# Patient Record
Sex: Female | Born: 1955
Health system: Southern US, Community
[De-identification: ages and names within clinical notes are randomized; demographics above are authoritative.]

## PROBLEM LIST (undated history)

## (undated) DIAGNOSIS — I10 Essential (primary) hypertension: Secondary | ICD-10-CM

## (undated) DIAGNOSIS — K219 Gastro-esophageal reflux disease without esophagitis: Secondary | ICD-10-CM

## (undated) DIAGNOSIS — E119 Type 2 diabetes mellitus without complications: Secondary | ICD-10-CM

## (undated) DIAGNOSIS — Z9889 Other specified postprocedural states: Secondary | ICD-10-CM

## (undated) DIAGNOSIS — E7800 Pure hypercholesterolemia, unspecified: Secondary | ICD-10-CM

## (undated) HISTORY — DX: Type 2 diabetes mellitus without complications: E11.9

## (undated) HISTORY — DX: Gastro-esophageal reflux disease without esophagitis: K21.9

## (undated) HISTORY — PX: OTHER SURGICAL HISTORY: SHX169

## (undated) HISTORY — DX: Essential (primary) hypertension: I10

## (undated) HISTORY — DX: Other specified postprocedural states: Z98.890

## (undated) HISTORY — DX: Pure hypercholesterolemia, unspecified: E78.00

---

## 2005-11-18 ENCOUNTER — Encounter (INDEPENDENT_AMBULATORY_CARE_PROVIDER_SITE_OTHER): Payer: Self-pay | Admitting: Specialist

## 2005-11-19 ENCOUNTER — Ambulatory Visit: Payer: Self-pay | Admitting: Internal Medicine

## 2005-11-19 ENCOUNTER — Ambulatory Visit (HOSPITAL_COMMUNITY): Admission: RE | Admit: 2005-11-19 | Discharge: 2005-11-19 | Payer: Self-pay | Admitting: Internal Medicine

## 2007-04-05 ENCOUNTER — Ambulatory Visit (HOSPITAL_COMMUNITY): Admission: RE | Admit: 2007-04-05 | Discharge: 2007-04-05 | Payer: Self-pay | Admitting: Obstetrics and Gynecology

## 2007-12-26 ENCOUNTER — Other Ambulatory Visit: Admission: RE | Admit: 2007-12-26 | Discharge: 2007-12-26 | Payer: Self-pay | Admitting: Obstetrics and Gynecology

## 2008-05-17 ENCOUNTER — Ambulatory Visit (HOSPITAL_COMMUNITY): Admission: RE | Admit: 2008-05-17 | Discharge: 2008-05-17 | Payer: Self-pay | Admitting: Obstetrics and Gynecology

## 2009-02-06 ENCOUNTER — Other Ambulatory Visit: Admission: RE | Admit: 2009-02-06 | Discharge: 2009-02-06 | Payer: Self-pay | Admitting: Obstetrics and Gynecology

## 2009-05-21 ENCOUNTER — Ambulatory Visit (HOSPITAL_COMMUNITY): Admission: RE | Admit: 2009-05-21 | Discharge: 2009-05-21 | Payer: Self-pay | Admitting: Obstetrics and Gynecology

## 2010-07-24 ENCOUNTER — Other Ambulatory Visit
Admission: RE | Admit: 2010-07-24 | Discharge: 2010-07-24 | Payer: Self-pay | Source: Home / Self Care | Admitting: Obstetrics and Gynecology

## 2010-11-18 ENCOUNTER — Encounter: Payer: Self-pay | Admitting: Internal Medicine

## 2010-11-27 ENCOUNTER — Encounter: Payer: Self-pay | Admitting: Gastroenterology

## 2010-11-27 ENCOUNTER — Ambulatory Visit (INDEPENDENT_AMBULATORY_CARE_PROVIDER_SITE_OTHER): Payer: 59 | Admitting: Gastroenterology

## 2010-11-27 VITALS — BP 141/84 | HR 106 | Temp 98.5°F | Ht 61.0 in | Wt 160.0 lb

## 2010-11-27 DIAGNOSIS — K219 Gastro-esophageal reflux disease without esophagitis: Secondary | ICD-10-CM

## 2010-11-27 DIAGNOSIS — Z86010 Personal history of colonic polyps: Secondary | ICD-10-CM

## 2010-11-27 NOTE — Progress Notes (Signed)
Primary Care Physician:  GRIFFIN,JENNIFER, NP, NP Primary Gastroenterologist:  Dr. Jena Gauss  Chief Complaint  Patient presents with  . Colon Cancer Screening    HPI:  Margaret Flowers is a 55 y.o. female here as a new patient for a visit prior to updated surveillance colonoscopy. Last colonoscopy in 2007: internal hemorrhoids, tubular adenomas. She is doing well without any concerns currently. Denies abdominal pain, constipation, N/V, melena, brbpr, lack of appetite, or wt loss. No change in bowel habits. Hx of GERD, controlled on Prilosec daily. Denies dysphagia or odynophagia.   Past Medical History  Diagnosis Date  . Hypertension   . GERD (gastroesophageal reflux disease)   . S/P colonoscopy 2002, 2007    2002: tubular adenomas; 2007: internal hemorrhoids, tubular adenoma    Past Surgical History  Procedure Date  . None     Current Outpatient Prescriptions  Medication Sig Dispense Refill  . amLODipine (NORVASC) 10 MG tablet Take 10 mg by mouth daily.        . Cholecalciferol (VITAMIN D3) 5000 UNITS CAPS Take by mouth.        . CHONDROITIN SULFATE PO Take 600 mg by mouth.        . fish oil-omega-3 fatty acids 1000 MG capsule Take 2 g by mouth daily.        . hydrochlorothiazide (,MICROZIDE/HYDRODIURIL,) 12.5 MG capsule Take 12.5 mg by mouth daily.        . Nutritional Supplements (GLUCOSAMINE FORTE PO) Take 750 mg by mouth.        Marland Kitchen omeprazole (PRILOSEC) 20 MG capsule Take 20 mg by mouth daily.        Marland Kitchen POTASSIUM & SODIUM PHOSPHATES PO Take 550 mg by mouth.          Allergies as of 11/27/2010  . (No Known Allergies)    Family History  Problem Relation Age of Onset  . Kidney cancer Mother     living  . Skin cancer Mother   . Breast cancer Mother   . Breast cancer Sister     living  . Pernicious anemia Father     deceased  . Pancreatic cancer Brother     passed away age 98    History   Social History  . Marital Status: Married    Spouse Name: N/A    Number  of Children: N/A  . Years of Education: N/A   Occupational History  . Full-time      Web designer    Social History Main Topics  . Smoking status: Never Smoker   . Smokeless tobacco: Not on file  . Alcohol Use: No  . Drug Use: No     Review of Systems: Gen: Denies any fever, chills, sweats, anorexia, fatigue, weakness, malaise, weight loss, and sleep disorder CV: Denies chest pain, angina, palpitations, syncope, orthopnea, PND, peripheral edema, and claudication. Resp: Denies dyspnea at rest, dyspnea with exercise, cough, sputum, wheezing, coughing up blood, and pleurisy. GI: Denies vomiting blood, jaundice, and fecal incontinence.   Denies dysphagia or odynophagia. GU : Denies urinary burning, blood in urine, urinary frequency, urinary hesitancy, nocturnal urination, and urinary incontinence. MS: Denies joint pain, limitation of movement, and swelling, stiffness, low back pain, extremity pain. Denies muscle weakness, cramps, atrophy.  Derm: Denies rash, itching, dry skin, hives, moles, warts, or unhealing ulcers.  Psych: Denies depression, anxiety, memory loss, suicidal ideation, hallucinations, paranoia, and confusion. Heme: Denies bruising, bleeding, and enlarged lymph nodes.  Physical Exam:  BP 141/84  Pulse 106  Temp(Src) 98.5 F (36.9 C) (Temporal)  Ht 5\' 1"  (1.549 m)  Wt 160 lb (72.576 kg)  BMI 30.23 kg/m2 General:   Alert,  Well-developed, well-nourished, pleasant and cooperative in NAD Head:  Normocephalic and atraumatic. Eyes:  Sclera clear, no icterus.   Conjunctiva pink. Ears:  Normal auditory acuity. Nose:  No deformity, discharge,  or lesions. Mouth:  No deformity or lesions, dentition normal. Neck:  Supple; no masses or thyromegaly. Lungs:  Clear throughout to auscultation.   No wheezes, crackles, or rhonchi. No acute distress. Heart:  Regular rate and rhythm; no murmurs, clicks, rubs,  or gallops. Abdomen:  Soft, nontender and nondistended.  No masses, hepatosplenomegaly or hernias noted. Normal bowel sounds, without guarding, and without rebound.   Rectal:  Deferred until time of colonoscopy.   Msk:  Symmetrical without gross deformities. Normal posture. Extremities:  Without clubbing or edema. Neurologic:  Alert and  oriented x4;  grossly normal neurologically. Skin:  Intact without significant lesions or rashes. Cervical Nodes:  No significant cervical adenopathy. Psych:  Alert and cooperative. Normal mood and affect.

## 2010-11-28 ENCOUNTER — Encounter: Payer: Self-pay | Admitting: Gastroenterology

## 2010-11-28 DIAGNOSIS — K219 Gastro-esophageal reflux disease without esophagitis: Secondary | ICD-10-CM | POA: Insufficient documentation

## 2010-11-28 DIAGNOSIS — Z86010 Personal history of colonic polyps: Secondary | ICD-10-CM | POA: Insufficient documentation

## 2010-11-28 NOTE — Assessment & Plan Note (Signed)
55 year old, very pleasant Caucasian female who presents for routine surveillance colonoscopy. Hx of tubular adenomas in past both in 2002 and 2007. Denies any abdominal pain, N/V, or rectal bleeding. No concerns currently and doing well.  Proceed with TCS with Dr. Jena Gauss in near future: the risks, benefits, and alternatives have been discussed with the patient in detail. The patient states understanding and desires to proceed.

## 2010-11-28 NOTE — Assessment & Plan Note (Signed)
Without dysphagia or odynophagia. No break-through reflux. Doing well on Prilosec daily. Continue current plan, no new interventions needed.

## 2010-11-28 NOTE — Op Note (Signed)
Margaret Flowers, Margaret Flowers            ACCOUNT NO.:  [id number]   MEDICAL RECORD NO.:  [id number]          PATIENT TYPE:  AMB   LOCATION:  DAY                           FACILITY:  [facility]   PHYSICIAN:  R. Roetta Sessions, M.D. DATE OF BIRTH:  [birth year]   DATE OF PROCEDURE:  11/19/2005  DATE OF DISCHARGE:                                 OPERATIVE REPORT   PROCEDURE PERFORMED:  Colonoscopy with cold biopsy.   INDICATIONS FOR PROCEDURE:  The patient is a  55 year old lady with a  history of colonic adenomas removed in 2002.  She is here for surveillance.  She does not have any lower GI tract symptoms.  Colonoscopy is now being  done as a surveillance maneuver.  This approach has been discussed with the  patient at length.  Potential risks, benefits and alternatives have been  reviewed, questions have been answered, she is agreeable.  Please see  documentation in the medical records.   PROCEDURE NOTE:  Oxygen saturations, blood pressure, pulse and respirations  were monitored throughout the entire procedure.   CONSCIOUS SEDATION:  Versed 6 mg IV, Demerol 100 mg IV in divided doses.   INSTRUMENT USED:  Olympus video chip system.   FINDINGS:  Digital rectal exam revealed no abnormalities.   ENDOSCOPIC FINDINGS:  Prep was good.   Rectum:  Examination of the rectal mucosa including retroflex view of the  anal verge revealed only a couple of anal papilla and some internal  hemorrhoids.  Colon:  Colonic mucosa was surveyed from the rectosigmoid junction to the  left, transverse and right colon to the area of the appendiceal orifice and  ileocecal valve and cecum.  These structures were well seen and photographed  for the record.  From this level, the scope was slowly withdrawn.  All  previously mentioned mucosal surfaces were again seen.  The patient was  noted to have a 4 mm sessile polyp on a fold opposite the ileocecal valve.  Please see photos.  It was cold biopsied and removed.  Remainder  of colonic  mucosa appeared normal.  The patient tolerated the procedure well, was  reacted in endoscopy.   IMPRESSION:  1.  Anal papilla and internal hemorrhoids, otherwise normal rectum.  2.  Diminutive polyp opposite ileocecal valve, cold biopsied/removed.      Remainder of colonic mucosa appeared normal.   RECOMMENDATIONS:  1.  Follow-up on pathology.  2.  Further recommendations to follow.      Jonathon Bellows, M.D.  Electronically Signed     RMR/MEDQ  D:  11/19/2005  T:  11/20/2005  Job:  [id number]

## 2010-12-01 NOTE — Progress Notes (Signed)
Cc to PCP 

## 2010-12-11 ENCOUNTER — Other Ambulatory Visit: Payer: Self-pay | Admitting: Internal Medicine

## 2010-12-11 ENCOUNTER — Ambulatory Visit (HOSPITAL_COMMUNITY)
Admission: RE | Admit: 2010-12-11 | Discharge: 2010-12-11 | Disposition: A | Discharge status: Home / Self Care | Payer: 59 | Source: Ambulatory Visit | Attending: Internal Medicine | Admitting: Internal Medicine

## 2010-12-11 ENCOUNTER — Encounter: Payer: 59 | Admitting: Internal Medicine

## 2010-12-11 DIAGNOSIS — D129 Benign neoplasm of anus and anal canal: Secondary | ICD-10-CM | POA: Insufficient documentation

## 2010-12-11 DIAGNOSIS — Z860100 Personal history of colon polyps, unspecified: Secondary | ICD-10-CM | POA: Insufficient documentation

## 2010-12-11 DIAGNOSIS — Z79899 Other long term (current) drug therapy: Secondary | ICD-10-CM | POA: Insufficient documentation

## 2010-12-11 DIAGNOSIS — D128 Benign neoplasm of rectum: Secondary | ICD-10-CM | POA: Insufficient documentation

## 2010-12-11 DIAGNOSIS — K5730 Diverticulosis of large intestine without perforation or abscess without bleeding: Secondary | ICD-10-CM

## 2010-12-11 DIAGNOSIS — D126 Benign neoplasm of colon, unspecified: Secondary | ICD-10-CM | POA: Insufficient documentation

## 2010-12-11 DIAGNOSIS — I10 Essential (primary) hypertension: Secondary | ICD-10-CM | POA: Insufficient documentation

## 2010-12-15 ENCOUNTER — Other Ambulatory Visit: Payer: Self-pay | Admitting: Obstetrics & Gynecology

## 2010-12-15 DIAGNOSIS — Z139 Encounter for screening, unspecified: Secondary | ICD-10-CM

## 2010-12-18 ENCOUNTER — Encounter: Payer: Self-pay | Admitting: Internal Medicine

## 2010-12-25 ENCOUNTER — Ambulatory Visit (HOSPITAL_COMMUNITY)
Admission: RE | Admit: 2010-12-25 | Discharge: 2010-12-25 | Disposition: A | Discharge status: Home / Self Care | Payer: 59 | Source: Ambulatory Visit | Attending: Obstetrics & Gynecology | Admitting: Obstetrics & Gynecology

## 2010-12-25 DIAGNOSIS — Z139 Encounter for screening, unspecified: Secondary | ICD-10-CM

## 2010-12-25 DIAGNOSIS — Z1231 Encounter for screening mammogram for malignant neoplasm of breast: Secondary | ICD-10-CM | POA: Insufficient documentation

## 2011-01-26 NOTE — Consult Note (Signed)
  NAMEAVICE, FUNCHESS            ACCOUNT NO.:  [id number]  MEDICAL RECORD NO.:  [id number]           PATIENT TYPE:  O  LOCATION:  DAYP                          FACILITY:  [facility]  PHYSICIAN:  R. Roetta Sessions, MD FACP FACGDATE OF BIRTH:  12/15/1955  DATE OF CONSULTATION:  12/11/2010 DATE OF DISCHARGE:                                CONSULTATION   PROCEDURE:  Ileocolonoscopy with snare polypectomy biopsy.  INDICATIONS FOR PROCEDURE:  A 55 year old lady with history of colonic adenomas removed in 2002-2008.  She is here for surveillance, has no lower GI tract symptoms at this time.  Risks, benefits, limitations, alternatives, imponderables have been discussed, questions answered. Please see the documentation in the medical record.  PROCEDURE NOTE:  O2 saturation, blood pressure, pulse, respirations were monitored throughout the entirety of the procedure.  CONSCIOUS SEDATION:  Versed 5 mg IV, Demerol 75 mg IV in divided doses.  INSTRUMENT:  Pentax video chip system.  FINDINGS:  Digital rectal exam revealed no abnormalities.  Endoscopic findings:  Prep was adequate.  Colon:  Colonic mucosa was surveyed from the rectosigmoid junction through the left transverse right colon to the appendiceal orifice ileocecal valve/cecum.  These structures were well seen and photographed the record.  From this level, scope was slowly and cautiously withdrawn.  All previously mentioned mucosal surfaces were again seen.  Terminal ileum was intubated to 10 cm.  The patient had multiple colonic polyps.  She had a diminutive cecal polyp which was removed with cold biopsy technique.  Sigmoid polyps which removed with cold and hot snare.  There was a descending colon polyp which was removed with hot snare quadrant, but was not recovered for the pathologist.  The patient had pancolonic diverticula, however, the remainder of colonic mucosa appeared normal.  Scope was pulled down to the rectum where a  thorough examination of rectal mucosa including retroflexed view of the anal verge demonstrated a 6-mm polyp at 4 cm at the anal verge was hot snare removed and recovered.  The patient tolerated the procedure well.  Cecal withdrawal time 11 minutes.  IMPRESSION: 1. Rectal polyp with status post hot snare polypectomy. 2. Multiple colonic polyps with status post biopsy and/or snare     polypectomy as described above. 3. Pancolonic diverticula.  RECOMMENDATIONS: 1. No aspirin or arthritis medications for 7 days. 2. Follow up on path.  Followup on diverticulosis literature provided     to Ms. Sills. 3. Further recommendations to follow.     Jonathon Bellows, MD [facility]     RMR/MEDQ  D:  12/11/2010  T:  12/12/2010  Job:  [id number]  cc:   Tilda Burrow, M.D. Fax: [phone]  Electronically Signed by Lorrin Goodell M.D. on 01/26/2011 09:12:15 AM

## 2012-11-07 ENCOUNTER — Other Ambulatory Visit: Payer: Self-pay | Admitting: Adult Health

## 2012-11-08 ENCOUNTER — Other Ambulatory Visit: Payer: Self-pay | Admitting: Adult Health

## 2012-12-14 ENCOUNTER — Encounter: Payer: Self-pay | Admitting: *Deleted

## 2012-12-15 ENCOUNTER — Other Ambulatory Visit (HOSPITAL_COMMUNITY)
Admission: RE | Admit: 2012-12-15 | Discharge: 2012-12-15 | Disposition: A | Discharge status: Home / Self Care | Payer: BC Managed Care – PPO | Source: Ambulatory Visit | Attending: Adult Health | Admitting: Adult Health

## 2012-12-15 ENCOUNTER — Encounter: Payer: Self-pay | Admitting: Adult Health

## 2012-12-15 ENCOUNTER — Ambulatory Visit (INDEPENDENT_AMBULATORY_CARE_PROVIDER_SITE_OTHER): Payer: BC Managed Care – PPO | Admitting: Adult Health

## 2012-12-15 VITALS — BP 152/102 | HR 78 | Ht 60.5 in | Wt 160.0 lb

## 2012-12-15 DIAGNOSIS — E119 Type 2 diabetes mellitus without complications: Secondary | ICD-10-CM

## 2012-12-15 DIAGNOSIS — Z01419 Encounter for gynecological examination (general) (routine) without abnormal findings: Secondary | ICD-10-CM | POA: Insufficient documentation

## 2012-12-15 DIAGNOSIS — Z1151 Encounter for screening for human papillomavirus (HPV): Secondary | ICD-10-CM | POA: Insufficient documentation

## 2012-12-15 DIAGNOSIS — I10 Essential (primary) hypertension: Secondary | ICD-10-CM

## 2012-12-15 DIAGNOSIS — Z1212 Encounter for screening for malignant neoplasm of rectum: Secondary | ICD-10-CM

## 2012-12-15 LAB — CBC
MCV: 82.5 fL (ref 78.0–100.0)
Platelets: 245 10*3/uL (ref 150–400)
WBC: 7.3 10*3/uL (ref 4.0–10.5)

## 2012-12-15 LAB — HEMOCCULT GUIAC POC 1CARD (OFFICE)

## 2012-12-15 NOTE — Progress Notes (Signed)
Patient ID: Margaret Flowers, female   DOB: March 29, 1956, 57 y.o.   MRN: [id number] History of Present Illness: Margaret Flowers is a 57 year old white female married in for a pap and physical.   Current Medications, Allergies, Past Medical History, Past Surgical History, Family History and Social History were reviewed in [employer] electronic medical record.     Review of Systems: Patient denies any headaches, blurred vision, shortness of breath, chest pain, abdominal pain, problems with bowel movements, urination, or intercourse. No joint pain some leg cramps, no mood swings.She does have some occasional constipation.    Physical Exam:BP 152/102  Pulse 78  Ht 5' 0.5" (1.537 m)  Wt 160 lb (72.576 kg)  BMI 30.72 kg/m2BP re check 142/90. General:  Well developed, well nourished, no acute distress Skin:  Warm and dry Neck:  Midline trachea, normal thyroid Lungs; Clear to auscultation bilaterally Breast:  No dominant palpable mass, retraction, or nipple discharge Cardiovascular: Regular rate and rhythm Abdomen:  Soft, non tender, no hepatosplenomegaly Pelvic:  External genitalia is normal in appearance for her age.  The vagina has decrease color and rugae with fair moisture.  The cervix is atrophic.Pap performed with HPV.  Uterus is felt to be normal size, shape, and contour.  No adnexal masses or tenderness noted. Rectal: Good sphincter tone, no polyps, or hemorrhoids felt.  Hemoccult negative. Extremities:  No swelling  noted Psych:  Alert an cooperative, seems happy   Impression: Yearly gyn exam Diabetes Hypertension  Plan: Physical in 1 year Mammogram yearly Colonoscopy per GI  Check CBC.CMP,TSH, lipids and A1c

## 2012-12-15 NOTE — Patient Instructions (Addendum)
Mammogram yearly Physical in 1 year Colonoscopy as per GI

## 2012-12-16 LAB — LIPID PANEL
Cholesterol: 226 mg/dL — ABNORMAL HIGH (ref 0–200)
Triglycerides: 145 mg/dL (ref <150)
VLDL: 29 mg/dL (ref 0–40)

## 2012-12-16 LAB — TSH: TSH: 1.453 u[IU]/mL (ref 0.350–4.500)

## 2012-12-16 LAB — COMPREHENSIVE METABOLIC PANEL
ALT: 20 U/L (ref 0–35)
Albumin: 4.5 g/dL (ref 3.5–5.2)
Calcium: 10 mg/dL (ref 8.4–10.5)
Chloride: 98 mEq/L (ref 96–112)
Sodium: 139 mEq/L (ref 135–145)

## 2012-12-16 LAB — HEMOGLOBIN A1C: Mean Plasma Glucose: 123 mg/dL — ABNORMAL HIGH (ref <117)

## 2012-12-19 ENCOUNTER — Telehealth: Payer: Self-pay | Admitting: Adult Health

## 2012-12-19 NOTE — Telephone Encounter (Signed)
Pt aware of labs  

## 2012-12-19 NOTE — Telephone Encounter (Signed)
Left message to call about labs 

## 2013-01-19 ENCOUNTER — Other Ambulatory Visit: Payer: Self-pay | Admitting: Adult Health

## 2013-01-19 DIAGNOSIS — Z139 Encounter for screening, unspecified: Secondary | ICD-10-CM

## 2013-01-24 ENCOUNTER — Ambulatory Visit (HOSPITAL_COMMUNITY)
Admission: RE | Admit: 2013-01-24 | Discharge: 2013-01-24 | Disposition: A | Discharge status: Home / Self Care | Payer: BC Managed Care – PPO | Source: Ambulatory Visit | Attending: Adult Health | Admitting: Adult Health

## 2013-01-24 DIAGNOSIS — Z139 Encounter for screening, unspecified: Secondary | ICD-10-CM

## 2013-01-24 DIAGNOSIS — Z1231 Encounter for screening mammogram for malignant neoplasm of breast: Secondary | ICD-10-CM | POA: Insufficient documentation

## 2013-02-10 ENCOUNTER — Other Ambulatory Visit: Payer: Self-pay | Admitting: Adult Health

## 2013-05-18 ENCOUNTER — Other Ambulatory Visit: Payer: Self-pay

## 2013-10-05 ENCOUNTER — Other Ambulatory Visit: Payer: Self-pay | Admitting: Adult Health

## 2014-02-21 ENCOUNTER — Other Ambulatory Visit: Payer: Self-pay | Admitting: Adult Health

## 2014-02-21 DIAGNOSIS — Z139 Encounter for screening, unspecified: Secondary | ICD-10-CM

## 2014-02-26 ENCOUNTER — Ambulatory Visit (HOSPITAL_COMMUNITY)
Admission: RE | Admit: 2014-02-26 | Discharge: 2014-02-26 | Disposition: A | Discharge status: Home / Self Care | Payer: BC Managed Care – PPO | Source: Ambulatory Visit | Attending: Adult Health | Admitting: Adult Health

## 2014-02-26 DIAGNOSIS — Z139 Encounter for screening, unspecified: Secondary | ICD-10-CM

## 2014-02-26 DIAGNOSIS — Z1231 Encounter for screening mammogram for malignant neoplasm of breast: Secondary | ICD-10-CM | POA: Insufficient documentation

## 2014-03-12 ENCOUNTER — Other Ambulatory Visit: Payer: Self-pay | Admitting: Adult Health

## 2014-05-14 ENCOUNTER — Encounter: Payer: Self-pay | Admitting: Adult Health

## 2014-10-10 ENCOUNTER — Other Ambulatory Visit: Payer: Self-pay | Admitting: Adult Health

## 2014-12-04 ENCOUNTER — Encounter: Payer: Self-pay | Admitting: Adult Health

## 2014-12-04 ENCOUNTER — Ambulatory Visit (INDEPENDENT_AMBULATORY_CARE_PROVIDER_SITE_OTHER): Payer: BLUE CROSS/BLUE SHIELD | Admitting: Adult Health

## 2014-12-04 VITALS — BP 140/78 | HR 92 | Ht 60.75 in | Wt 161.5 lb

## 2014-12-04 DIAGNOSIS — Z01419 Encounter for gynecological examination (general) (routine) without abnormal findings: Secondary | ICD-10-CM

## 2014-12-04 DIAGNOSIS — Z1212 Encounter for screening for malignant neoplasm of rectum: Secondary | ICD-10-CM

## 2014-12-04 DIAGNOSIS — I10 Essential (primary) hypertension: Secondary | ICD-10-CM

## 2014-12-04 DIAGNOSIS — E119 Type 2 diabetes mellitus without complications: Secondary | ICD-10-CM

## 2014-12-04 LAB — HEMOCCULT GUIAC POC 1CARD (OFFICE): Fecal Occult Blood, POC: NEGATIVE

## 2014-12-04 MED ORDER — AMLODIPINE BESYLATE 10 MG PO TABS: 10.0000 mg | ORAL_TABLET | Freq: Every day | ORAL | Status: DC

## 2014-12-04 MED ORDER — HYDROCHLOROTHIAZIDE 12.5 MG PO CAPS: 12.5000 mg | ORAL_CAPSULE | Freq: Every day | ORAL | Status: DC

## 2014-12-04 MED ORDER — OMEPRAZOLE 20 MG PO CPDR: 20.0000 mg | DELAYED_RELEASE_CAPSULE | Freq: Every day | ORAL | Status: DC

## 2014-12-04 NOTE — Patient Instructions (Signed)
Pap and physical in  1year Mammogram yearly Colonoscopy this year Will talk when labs back

## 2014-12-04 NOTE — Progress Notes (Signed)
Patient ID: [name], female   DOB: [birth year], 59 y.o.   MRN: [id number] History of Present Illness: Margaret Flowers is a 59 year old white female, married in for well woman gyn exam.She had a normal pap with negative HPV 12/15/12.   Current Medications, Allergies, Past Medical History, Past Surgical History, Family History and Social History were reviewed in [employer] record.     Review of Systems:  Patient denies any headaches, hearing loss, fatigue, blurred vision, shortness of breath, chest pain, abdominal pain, problems with bowel movements, urination, or intercourse. No joint pain or mood swings.   Physical Exam:BP 140/78 mmHg  Pulse 92  Ht 5' 0.75" (1.543 m)  Wt 161 lb 8 oz (73.256 kg)  BMI 30.77 kg/m2 General:  Well developed, well nourished, no acute distress Skin:  Warm and dry Neck:  Midline trachea, normal thyroid, good ROM, no lymphadenopathy, no carotid bruits heard Lungs; Clear to auscultation bilaterally Breast:  No dominant palpable mass, retraction, or nipple discharge Cardiovascular: Regular rate and rhythm Abdomen:  Soft, non tender, no hepatosplenomegaly Pelvic:  External genitalia is normal in appearance, no lesions.  The vagina has decreased color, moisture and rugae. Urethra has no lesions or masses. The cervix is smmoth.  Uterus is felt to be normal size, shape, and contour.  No adnexal masses or tenderness noted.Bladder is non tender, no masses felt. Rectal: Good sphincter tone, no polyps, or hemorrhoids felt.  Hemoccult negative. Extremities/musculoskeletal:  No swelling or varicosities noted, no clubbing or cyanosis, has what looks like AK right outer calf, it is round and about the size of pencil eraser and slightly crusty Psych:  No mood changes, alert and cooperative,seems happy   Impression: Well woman gyn exam no pap Hypertension Diabetes, diet controlled    Plan: Check CBC,CMP,TSH and lipids, A1c Refilled Norvasc 10 mg,  Prilosec 20 mg, and Microzide 12.5 #90 take 1 daily with 4 refills Mammogram yearly Pap and physical in  1 year Colonoscopy this year See dermatologist

## 2014-12-05 ENCOUNTER — Telehealth: Payer: Self-pay | Admitting: Adult Health

## 2014-12-05 LAB — CBC
HEMATOCRIT: 40.4 % (ref 34.0–46.6)
HEMOGLOBIN: 13.8 g/dL (ref 11.1–15.9)
MCH: 28.1 pg (ref 26.6–33.0)
MCHC: 34.2 g/dL (ref 31.5–35.7)
MCV: 82 fL (ref 79–97)
PLATELETS: 269 10*3/uL (ref 150–379)
RBC: 4.91 x10E6/uL (ref 3.77–5.28)
RDW: 15 % (ref 12.3–15.4)
WBC: 7.1 10*3/uL (ref 3.4–10.8)

## 2014-12-05 LAB — COMPREHENSIVE METABOLIC PANEL
ALBUMIN: 4.6 g/dL (ref 3.6–4.8)
ALK PHOS: 77 IU/L (ref 39–117)
ALT: 35 IU/L — AB (ref 0–32)
AST: 36 IU/L (ref 0–40)
Albumin/Globulin Ratio: 1.5 (ref 1.1–2.5)
BILIRUBIN TOTAL: 0.5 mg/dL (ref 0.0–1.2)
BUN / CREAT RATIO: 17 (ref 11–26)
BUN: 17 mg/dL (ref 8–27)
CALCIUM: 9.7 mg/dL (ref 8.7–10.3)
CO2: 23 mmol/L (ref 18–29)
CREATININE: 0.98 mg/dL (ref 0.57–1.00)
Chloride: 100 mmol/L (ref 97–108)
GFR calc Af Amer: 72 mL/min/{1.73_m2} (ref >59)
GFR calc non Af Amer: 62 mL/min/{1.73_m2} (ref >59)
GLOBULIN, TOTAL: 3.1 g/dL (ref 1.5–4.5)
GLUCOSE: 112 mg/dL — AB (ref 65–99)
POTASSIUM: 4.1 mmol/L (ref 3.5–5.2)
Sodium: 141 mmol/L (ref 134–144)
TOTAL PROTEIN: 7.7 g/dL (ref 6.0–8.5)

## 2014-12-05 LAB — TSH: TSH: 1.74 u[IU]/mL (ref 0.450–4.500)

## 2014-12-05 LAB — LIPID PANEL
CHOLESTEROL TOTAL: 216 mg/dL — AB (ref 100–199)
Chol/HDL Ratio: 3.7 ratio units (ref 0.0–4.4)
HDL: 58 mg/dL (ref >39)
LDL CALC: 127 mg/dL — AB (ref 0–99)
TRIGLYCERIDES: 157 mg/dL — AB (ref 0–149)
VLDL Cholesterol Cal: 31 mg/dL (ref 5–40)

## 2014-12-05 LAB — HEMOGLOBIN A1C
Est. average glucose Bld gHb Est-mCnc: 134 mg/dL
Hgb A1c MFr Bld: 6.3 % — ABNORMAL HIGH (ref 4.8–5.6)

## 2014-12-05 NOTE — Telephone Encounter (Signed)
Pt aware of labs and need to decrease carbs and increase exercise, will recheck labs in 6 months

## 2015-01-07 ENCOUNTER — Other Ambulatory Visit: Payer: Self-pay

## 2015-11-27 ENCOUNTER — Encounter: Payer: Self-pay | Admitting: Internal Medicine

## 2015-12-16 ENCOUNTER — Other Ambulatory Visit: Payer: Self-pay | Admitting: Adult Health

## 2016-01-03 ENCOUNTER — Other Ambulatory Visit (HOSPITAL_COMMUNITY)
Admission: RE | Admit: 2016-01-03 | Discharge: 2016-01-03 | Disposition: A | Discharge status: Home / Self Care | Payer: BLUE CROSS/BLUE SHIELD | Source: Ambulatory Visit | Attending: Adult Health | Admitting: Adult Health

## 2016-01-03 ENCOUNTER — Encounter: Payer: Self-pay | Admitting: Adult Health

## 2016-01-03 ENCOUNTER — Ambulatory Visit (INDEPENDENT_AMBULATORY_CARE_PROVIDER_SITE_OTHER): Payer: BLUE CROSS/BLUE SHIELD | Admitting: Adult Health

## 2016-01-03 VITALS — BP 140/78 | HR 88 | Ht 60.0 in | Wt 143.5 lb

## 2016-01-03 DIAGNOSIS — Z01419 Encounter for gynecological examination (general) (routine) without abnormal findings: Secondary | ICD-10-CM

## 2016-01-03 DIAGNOSIS — Z01411 Encounter for gynecological examination (general) (routine) with abnormal findings: Secondary | ICD-10-CM | POA: Insufficient documentation

## 2016-01-03 DIAGNOSIS — Z1212 Encounter for screening for malignant neoplasm of rectum: Secondary | ICD-10-CM | POA: Diagnosis not present

## 2016-01-03 DIAGNOSIS — Z1151 Encounter for screening for human papillomavirus (HPV): Secondary | ICD-10-CM | POA: Diagnosis not present

## 2016-01-03 DIAGNOSIS — E119 Type 2 diabetes mellitus without complications: Secondary | ICD-10-CM | POA: Diagnosis not present

## 2016-01-03 DIAGNOSIS — I10 Essential (primary) hypertension: Secondary | ICD-10-CM

## 2016-01-03 DIAGNOSIS — Z124 Encounter for screening for malignant neoplasm of cervix: Secondary | ICD-10-CM | POA: Diagnosis not present

## 2016-01-03 DIAGNOSIS — Z139 Encounter for screening, unspecified: Secondary | ICD-10-CM

## 2016-01-03 LAB — HEMOCCULT GUIAC POC 1CARD (OFFICE): Fecal Occult Blood, POC: NEGATIVE

## 2016-01-03 MED ORDER — AMLODIPINE BESYLATE 10 MG PO TABS: 10.0000 mg | ORAL_TABLET | Freq: Every day | ORAL | Status: DC

## 2016-01-03 MED ORDER — OMEPRAZOLE 20 MG PO CPDR: 20.0000 mg | DELAYED_RELEASE_CAPSULE | Freq: Every day | ORAL | Status: DC

## 2016-01-03 NOTE — Patient Instructions (Signed)
Physical in 1 year, pap in 3 if normal  Mammogram yearly Colonoscopy per GI

## 2016-01-03 NOTE — Progress Notes (Signed)
Patient ID: Margaret Flowers, female   DOB: [birth year], 60 y.o.   MRN: [id number] History of Present Illness: Margaret Flowers is a 60 year old white female, married in for a well woman gyn exam and pap.No complaints, has lost 17 lbs, since cutting carbs.   Current Medications, Allergies, Past Medical History, Past Surgical History, Family History and Social History were reviewed in [employer] record.     Review of Systems: Patient denies any headaches, hearing loss, fatigue, blurred vision, shortness of breath, chest pain, abdominal pain, problems with bowel movements, urination, or intercourse. No joint pain or mood swings.    Physical Exam:BP 140/78 mmHg  Pulse 88  Ht 5' (1.524 m)  Wt 143 lb 8 oz (65.091 kg)  BMI 28.03 kg/m2 General:  Well developed, well nourished, no acute distress Skin:  Warm and dry Neck:  Midline trachea, normal thyroid, good ROM, no lymphadenopathy Lungs; Clear to auscultation bilaterally Breast:  No dominant palpable mass, retraction, or nipple discharge Cardiovascular: Regular rate and rhythm Abdomen:  Soft, non tender, no hepatosplenomegaly Pelvic:  External genitalia is normal in appearance, no lesions.  The vagina is normal in appearance. Urethra has no lesions or masses. The cervix is smooth, pap with HPV performed.  Uterus is felt to be normal size, shape, and contour.  No adnexal masses or tenderness noted.Bladder is non tender, no masses felt. Rectal: Good sphincter tone, no polyps, or hemorrhoids felt.  Hemoccult negative. Extremities/musculoskeletal:  No swelling or varicosities noted, no clubbing or cyanosis Psych:  No mood changes, alert and cooperative,seems happy   Impression:  Well woman gyn exam and pap Hypertension Diabetes-diet controlled    Plan: Check CBC,CMP,TSH and lipids,A1c and vitamin D Refilled norvasc 10 mg #90 take 1 daily with 4 refills Refilled prilosec 20 mg #90 take 1 daily with 4 refills Physical in 1  year, pap in 3 if normal Mammogram yearly Referred to Dr Gala Romney for colonoscopy

## 2016-01-04 LAB — LIPID PANEL
CHOL/HDL RATIO: 3.5 ratio (ref 0.0–4.4)
Cholesterol, Total: 225 mg/dL — ABNORMAL HIGH (ref 100–199)
HDL: 65 mg/dL (ref >39)
LDL Calculated: 134 mg/dL — ABNORMAL HIGH (ref 0–99)
Triglycerides: 128 mg/dL (ref 0–149)
VLDL CHOLESTEROL CAL: 26 mg/dL (ref 5–40)

## 2016-01-04 LAB — CBC
Hematocrit: 42.2 % (ref 34.0–46.6)
Hemoglobin: 14.4 g/dL (ref 11.1–15.9)
MCH: 28.1 pg (ref 26.6–33.0)
MCHC: 34.1 g/dL (ref 31.5–35.7)
MCV: 82 fL (ref 79–97)
PLATELETS: 323 10*3/uL (ref 150–379)
RBC: 5.12 x10E6/uL (ref 3.77–5.28)
RDW: 14.4 % (ref 12.3–15.4)
WBC: 7.8 10*3/uL (ref 3.4–10.8)

## 2016-01-04 LAB — COMPREHENSIVE METABOLIC PANEL
ALK PHOS: 74 IU/L (ref 39–117)
ALT: 18 IU/L (ref 0–32)
AST: 19 IU/L (ref 0–40)
Albumin/Globulin Ratio: 1.4 (ref 1.2–2.2)
Albumin: 4.7 g/dL (ref 3.6–4.8)
BUN/Creatinine Ratio: 16 (ref 12–28)
BUN: 17 mg/dL (ref 8–27)
Bilirubin Total: 0.7 mg/dL (ref 0.0–1.2)
CALCIUM: 10.1 mg/dL (ref 8.7–10.3)
CO2: 23 mmol/L (ref 18–29)
Chloride: 97 mmol/L (ref 96–106)
Creatinine, Ser: 1.07 mg/dL — ABNORMAL HIGH (ref 0.57–1.00)
GFR calc Af Amer: 64 mL/min/{1.73_m2} (ref >59)
GFR, EST NON AFRICAN AMERICAN: 56 mL/min/{1.73_m2} — AB (ref >59)
GLOBULIN, TOTAL: 3.3 g/dL (ref 1.5–4.5)
Glucose: 99 mg/dL (ref 65–99)
Potassium: 3.8 mmol/L (ref 3.5–5.2)
SODIUM: 140 mmol/L (ref 134–144)
Total Protein: 8 g/dL (ref 6.0–8.5)

## 2016-01-04 LAB — HEMOGLOBIN A1C
ESTIMATED AVERAGE GLUCOSE: 120 mg/dL
HEMOGLOBIN A1C: 5.8 % — AB (ref 4.8–5.6)

## 2016-01-04 LAB — TSH: TSH: 1.32 u[IU]/mL (ref 0.450–4.500)

## 2016-01-04 LAB — VITAMIN D 25 HYDROXY (VIT D DEFICIENCY, FRACTURES): Vit D, 25-Hydroxy: 87.6 ng/mL (ref 30.0–100.0)

## 2016-01-06 LAB — CYTOLOGY - PAP

## 2016-01-08 ENCOUNTER — Telehealth: Payer: Self-pay | Admitting: Adult Health

## 2016-01-08 ENCOUNTER — Encounter: Payer: Self-pay | Admitting: Adult Health

## 2016-01-08 NOTE — Telephone Encounter (Signed)
Pt aware labs looking better, will fax her a copy

## 2016-01-08 NOTE — Telephone Encounter (Signed)
Left message to call about labs 

## 2016-03-12 ENCOUNTER — Other Ambulatory Visit: Payer: Self-pay | Admitting: Adult Health

## 2016-04-09 ENCOUNTER — Telehealth: Payer: Self-pay | Admitting: General Practice

## 2016-04-09 NOTE — Telephone Encounter (Signed)
Patient received a letter in June to schedule her tcs.  She is now ready to schedule her procedure and she can be reached at [phone] or at work [phone] ext:[phone]  Routing to [employer]

## 2016-04-13 NOTE — Telephone Encounter (Signed)
LMOM both numbers for a return call.  

## 2016-04-13 NOTE — Telephone Encounter (Signed)
Pt left Vm for me and I returned her all and left vm for a return call.

## 2016-04-13 NOTE — Telephone Encounter (Signed)
Triaged today.  

## 2016-04-15 ENCOUNTER — Other Ambulatory Visit: Payer: Self-pay

## 2016-04-15 DIAGNOSIS — Z86010 Personal history of colonic polyps: Secondary | ICD-10-CM

## 2016-04-15 NOTE — Telephone Encounter (Signed)
Gastroenterology Pre-Procedure Review  Request Date: 04/13/2016 Requesting Physician: RECALL  PATIENT REVIEW QUESTIONS: The patient responded to the following health history questions as indicated:    1. Diabetes Melitis: no 2. Joint replacements in the past 12 months: no 3. Major health problems in the past 3 months: no 4. Has an artificial valve or MVP: no 5. Has a defibrillator: no 6. Has been advised in past to take antibiotics in advance of a procedure like teeth cleaning: no 7. Family history of colon cancer: no  8. Alcohol Use: no 9. History of sleep apnea: no     MEDICATIONS & ALLERGIES:    Patient reports the following regarding taking any blood thinners:   Plavix? no Aspirin? YES Coumadin? no  Patient confirms/reports the following medications:  Current Outpatient Prescriptions  Medication Sig Dispense Refill  . amLODipine (NORVASC) 10 MG tablet Take 1 tablet (10 mg total) by mouth daily. 90 tablet 4  . aspirin 81 MG tablet Take 81 mg by mouth daily.    . Cholecalciferol (VITAMIN D3) 5000 UNITS CAPS Take by mouth daily.     . hydrochlorothiazide (MICROZIDE) 12.5 MG capsule TAKE ONE CAPSULE BY MOUTH ONCE DAILY 90 capsule 3  . omega-3 acid ethyl esters (LOVAZA) 1 g capsule Take 1,200 mg by mouth daily.    Marland Kitchen omeprazole (PRILOSEC) 20 MG capsule Take 1 capsule (20 mg total) by mouth daily. 90 capsule 4  . Turmeric 500 MG CAPS Take by mouth daily.    Marland Kitchen UNABLE TO FIND daily. Multi Vit Drink     No current facility-administered medications for this visit.     Patient confirms/reports the following allergies:  Allergies  Allergen Reactions  . Amoxicillin   . Benadryl [Diphenhydramine Hcl]   . Codeine   . Sulfa Antibiotics     No orders of the defined types were placed in this encounter.   AUTHORIZATION INFORMATION Primary Insurance:   ID #:   Group #:  Pre-Cert / Auth required: Pre-Cert / Auth #:   Secondary Insurance:  ID #: Group #:  Pre-Cert / Auth required:   Pre-Cert / Auth #:   SCHEDULE INFORMATION: Procedure has been scheduled as follows:  Date: 05/06/2016             Time:  11:30 Am Location: [facility] Short Stay  This Gastroenterology Pre-Precedure Review Form is being routed to the following provider(s): R. Garfield Cornea, MD

## 2016-04-16 MED ORDER — PEG 3350-KCL-NA BICARB-NACL 420 G PO SOLR: 4000.0000 mL | ORAL | 0 refills | Status: DC

## 2016-04-16 NOTE — Telephone Encounter (Signed)
Rx sent to the pharmacy and instructions mailed to pt.  

## 2016-04-16 NOTE — Addendum Note (Signed)
Addended by: Everardo All on: 04/16/2016 11:11 AM   Modules accepted: Orders

## 2016-04-16 NOTE — Telephone Encounter (Signed)
Patient with h/o colon polyps. Due for surveillance colonoscopy. Ok to schedule.

## 2016-05-01 DIAGNOSIS — Z23 Encounter for immunization: Secondary | ICD-10-CM | POA: Diagnosis not present

## 2016-05-05 NOTE — Telephone Encounter (Signed)
PA Not required for the colonoscopy as out patient. Spoke to [facility].

## 2016-05-06 ENCOUNTER — Ambulatory Visit (HOSPITAL_COMMUNITY)
Admission: RE | Admit: 2016-05-06 | Discharge: 2016-05-06 | Disposition: A | Discharge status: Home / Self Care | Payer: BLUE CROSS/BLUE SHIELD | Source: Ambulatory Visit | Attending: Internal Medicine | Admitting: Internal Medicine

## 2016-05-06 ENCOUNTER — Encounter (HOSPITAL_COMMUNITY)
Admission: RE | Disposition: A | Discharge status: Home / Self Care | Payer: Self-pay | Source: Ambulatory Visit | Attending: Internal Medicine

## 2016-05-06 ENCOUNTER — Encounter (HOSPITAL_COMMUNITY): Payer: Self-pay

## 2016-05-06 DIAGNOSIS — Z79899 Other long term (current) drug therapy: Secondary | ICD-10-CM | POA: Diagnosis not present

## 2016-05-06 DIAGNOSIS — E119 Type 2 diabetes mellitus without complications: Secondary | ICD-10-CM | POA: Diagnosis not present

## 2016-05-06 DIAGNOSIS — D120 Benign neoplasm of cecum: Secondary | ICD-10-CM | POA: Diagnosis not present

## 2016-05-06 DIAGNOSIS — D125 Benign neoplasm of sigmoid colon: Secondary | ICD-10-CM

## 2016-05-06 DIAGNOSIS — Z7982 Long term (current) use of aspirin: Secondary | ICD-10-CM | POA: Insufficient documentation

## 2016-05-06 DIAGNOSIS — K219 Gastro-esophageal reflux disease without esophagitis: Secondary | ICD-10-CM | POA: Diagnosis not present

## 2016-05-06 DIAGNOSIS — I10 Essential (primary) hypertension: Secondary | ICD-10-CM | POA: Diagnosis not present

## 2016-05-06 DIAGNOSIS — D124 Benign neoplasm of descending colon: Secondary | ICD-10-CM | POA: Diagnosis not present

## 2016-05-06 DIAGNOSIS — Z1211 Encounter for screening for malignant neoplasm of colon: Secondary | ICD-10-CM | POA: Insufficient documentation

## 2016-05-06 DIAGNOSIS — Z860101 Personal history of adenomatous and serrated colon polyps: Secondary | ICD-10-CM

## 2016-05-06 DIAGNOSIS — D123 Benign neoplasm of transverse colon: Secondary | ICD-10-CM | POA: Diagnosis not present

## 2016-05-06 DIAGNOSIS — K5730 Diverticulosis of large intestine without perforation or abscess without bleeding: Secondary | ICD-10-CM | POA: Insufficient documentation

## 2016-05-06 DIAGNOSIS — Z86010 Personal history of colonic polyps: Secondary | ICD-10-CM | POA: Insufficient documentation

## 2016-05-06 HISTORY — PX: COLONOSCOPY: SHX5424

## 2016-05-06 SURGERY — COLONOSCOPY
Anesthesia: Moderate Sedation

## 2016-05-06 MED ORDER — MIDAZOLAM HCL 5 MG/5ML IJ SOLN
INTRAMUSCULAR | Status: AC
  Filled 2016-05-06: qty 10

## 2016-05-06 MED ORDER — ONDANSETRON HCL 4 MG/2ML IJ SOLN
INTRAMUSCULAR | Status: DC | PRN
  Administered 2016-05-06: 4 mg via INTRAVENOUS

## 2016-05-06 MED ORDER — SODIUM CHLORIDE 0.9 % IV SOLN
INTRAVENOUS | Status: DC
  Administered 2016-05-06: 11:00:00 via INTRAVENOUS

## 2016-05-06 MED ORDER — ONDANSETRON HCL 4 MG/2ML IJ SOLN
INTRAMUSCULAR | Status: AC
  Filled 2016-05-06: qty 2

## 2016-05-06 MED ORDER — MEPERIDINE HCL 100 MG/ML IJ SOLN
INTRAMUSCULAR | Status: DC | PRN
  Administered 2016-05-06: 50 mg via INTRAVENOUS
  Administered 2016-05-06: 25 mg via INTRAVENOUS

## 2016-05-06 MED ORDER — MEPERIDINE HCL 100 MG/ML IJ SOLN
INTRAMUSCULAR | Status: AC
  Filled 2016-05-06: qty 2

## 2016-05-06 MED ORDER — MIDAZOLAM HCL 5 MG/5ML IJ SOLN
INTRAMUSCULAR | Status: DC | PRN
  Administered 2016-05-06: 1 mg via INTRAVENOUS
  Administered 2016-05-06 (×2): 2 mg via INTRAVENOUS

## 2016-05-06 NOTE — H&P (Signed)
@LOGO @   Primary Care Physician:  PROVIDER NOT [facility] Gastroenterologist:  Dr. Gala Romney  Pre-Procedure History & Physical: HPI:  Margaret Flowers is a 59 y.o. female here for surveillance colonoscopy. History of multiple colonic adenomas removed over time. No bowel symptoms currently.  Past Medical History:  Diagnosis Date  . Diabetes mellitus without complication (HCC)    prediabetic  . Elevated cholesterol   . GERD (gastroesophageal reflux disease)   . Hypertension   . S/P colonoscopy 2002, 2007   2002: tubular adenomas; 2007: internal hemorrhoids, tubular adenoma    Past Surgical History:  Procedure Laterality Date  . None      Prior to Admission medications   Medication Sig Start Date End Date Taking? Authorizing Provider  amLODipine (NORVASC) 10 MG tablet Take 1 tablet (10 mg total) by mouth daily. Patient taking differently: Take 10 mg by mouth every evening.  01/03/16  Yes Estill Dooms, NP  aspirin 81 MG tablet Take 81 mg by mouth daily.   Yes Historical Provider, MD  Cholecalciferol (VITAMIN D3) 5000 UNITS CAPS Take 5,000 Units by mouth daily.    Yes Historical Provider, MD  hydrochlorothiazide (MICROZIDE) 12.5 MG capsule TAKE ONE CAPSULE BY MOUTH ONCE DAILY 03/17/16  Yes Estill Dooms, NP  Omega 3 1200 MG CAPS Take 1,200 mg by mouth daily.   Yes Historical Provider, MD  omeprazole (PRILOSEC) 20 MG capsule Take 1 capsule (20 mg total) by mouth daily. 01/03/16  Yes Estill Dooms, NP  OVER THE COUNTER MEDICATION Take by mouth daily. Multivitamin drink once a day   Yes Historical Provider, MD  Turmeric 500 MG CAPS Take 500 mg by mouth every evening.    Yes Historical Provider, MD    Allergies as of 04/15/2016 - Review Complete 04/15/2016  Allergen Reaction Noted  . Amoxicillin  04/13/2016  . Benadryl [diphenhydramine hcl]  12/14/2012  . Codeine  12/14/2012  . Sulfa antibiotics  12/14/2012    Family History  Problem Relation Age of Onset  .  Kidney cancer Mother     living  . Skin cancer Mother   . Breast cancer Mother   . Breast cancer Sister     living  . Pernicious anemia Father     deceased  . Pancreatic cancer Brother     passed away age 61  . Hypertension Son   . Alzheimer's disease Maternal Grandmother   . Parkinson's disease Maternal Grandfather   . Fibromyalgia Sister   . Hypertension Son   . Parkinson's disease Maternal Uncle     Social History   Social History  . Marital status: Married    Spouse name: N/A  . Number of children: N/A  . Years of education: N/A   Occupational History  . Full-time  Actuary    Social History Main Topics  . Smoking status: Never Smoker  . Smokeless tobacco: Never Used  . Alcohol use No  . Drug use: No  . Sexual activity: Yes    Birth control/ protection: Post-menopausal   Other Topics Concern  . Not on file   Social History Narrative  . No narrative on file    Review of Systems: See HPI, otherwise negative ROS  Physical Exam: There were no vitals taken for this visit. General:   Alert,  Well-developed, well-nourished, pleasant and cooperative in NAD Skin:  Intact without significant lesions or rashes. Eyes:  Sclera clear, no  icterus.   Conjunctiva pink. Ears:  Normal auditory acuity. Nose:  No deformity, discharge,  or lesions. Mouth:  No deformity or lesions. Neck:  Supple; no masses or thyromegaly. No significant cervical adenopathy. Lungs:  Clear throughout to auscultation.   No wheezes, crackles, or rhonchi. No acute distress. Heart:  Regular rate and rhythm; no murmurs, clicks, rubs,  or gallops. Abdomen: Non-distended, normal bowel sounds.  Soft and nontender without appreciable mass or hepatosplenomegaly.  Pulses:  Normal pulses noted. Extremities:  Without clubbing or edema.  Impression:  Pleasant 60 year old lady with a history of multiple colonic adenomas removed over time-last colonoscopy  2002. Therefore a last colonoscopy at this time.  Recommendations: Surveillance colonoscopy today. The risks, benefits, limitations, alternatives and imponderables have been reviewed with the patient. Questions have been answered. All parties are agreeable.      Notice: This dictation was prepared with Dragon dictation along with smaller phrase technology. Any transcriptional errors that result from this process are unintentional and may not be corrected upon review.

## 2016-05-06 NOTE — Discharge Instructions (Addendum)
°Colonoscopy °Discharge Instructions ° °Read the instructions outlined below and refer to this sheet in the next few weeks. These discharge instructions provide you with general information on caring for yourself after you leave the hospital. Your doctor may also give you specific instructions. While your treatment has been planned according to the most current medical practices available, unavoidable complications occasionally occur. If you have any problems or questions after discharge, call Dr. Rourk at [phone]. °ACTIVITY °· You may resume your regular activity, but move at a slower pace for the next 24 hours.  °· Take frequent rest periods for the next 24 hours.  °· Walking will help get rid of the air and reduce the bloated feeling in your belly (abdomen).  °· No driving for 24 hours (because of the medicine (anesthesia) used during the test).   °· Do not sign any important legal documents or operate any machinery for 24 hours (because of the anesthesia used during the test).  °NUTRITION °· Drink plenty of fluids.  °· You may resume your normal diet as instructed by your doctor.  °· Begin with a light meal and progress to your normal diet. Heavy or fried foods are harder to digest and may make you feel sick to your stomach (nauseated).  °· Avoid alcoholic beverages for 24 hours or as instructed.  °MEDICATIONS °· You may resume your normal medications unless your doctor tells you otherwise.  °WHAT YOU CAN EXPECT TODAY °· Some feelings of bloating in the abdomen.  °· Passage of more gas than usual.  °· Spotting of blood in your stool or on the toilet paper.  °IF YOU HAD POLYPS REMOVED DURING THE COLONOSCOPY: °· No aspirin products for 7 days or as instructed.  °· No alcohol for 7 days or as instructed.  °· Eat a soft diet for the next 24 hours.  °FINDING OUT THE RESULTS OF YOUR TEST °Not all test results are available during your visit. If your test results are not back during the visit, make an appointment  with your caregiver to find out the results. Do not assume everything is normal if you have not heard from your caregiver or the medical facility. It is important for you to follow up on all of your test results.  °SEEK IMMEDIATE MEDICAL ATTENTION IF: °· You have more than a spotting of blood in your stool.  °· Your belly is swollen (abdominal distention).  °· You are nauseated or vomiting.  °· You have a temperature over 101.  °· You have abdominal pain or discomfort that is severe or gets worse throughout the day.  ° ° °Colon polyp and diverticulosis information provided ° °Further recommendations to follow pending review of pathology report ° ° °Diverticulosis °Diverticulosis is the condition that develops when small pouches (diverticula) form in the wall of your colon. Your colon, or large intestine, is where water is absorbed and stool is formed. The pouches form when the inside layer of your colon pushes through weak spots in the outer layers of your colon. °CAUSES  °No one knows exactly what causes diverticulosis. °RISK FACTORS °· Being older than 50. Your risk for this condition increases with age. Diverticulosis is rare in people younger than 40 years. By age 80, almost everyone has it. °· Eating a low-fiber diet. °· Being frequently constipated. °· Being overweight. °· Not getting enough exercise. °· Smoking. °· Taking over-the-counter pain medicines, like aspirin and ibuprofen. °SYMPTOMS  °Most people with diverticulosis do not have symptoms. °DIAGNOSIS  °  Because diverticulosis often has no symptoms, health care providers often discover the condition during an exam for other colon problems. In many cases, a health care provider will diagnose diverticulosis while using a flexible scope to examine the colon (colonoscopy). °TREATMENT  °If you have never developed an infection related to diverticulosis, you may not need treatment. If you have had an infection before, treatment may include: °· Eating more  fruits, vegetables, and grains. °· Taking a fiber supplement. °· Taking a live bacteria supplement (probiotic). °· Taking medicine to relax your colon. °HOME CARE INSTRUCTIONS  °· Drink at least 6-8 glasses of water each day to prevent constipation. °· Try not to strain when you have a bowel movement. °· Keep all follow-up appointments. °If you have had an infection before:  °· Increase the fiber in your diet as directed by your health care provider or dietitian. °· Take a dietary fiber supplement if your health care provider approves. °· Only take medicines as directed by your health care provider. °SEEK MEDICAL CARE IF:  °· You have abdominal pain. °· You have bloating. °· You have cramps. °· You have not gone to the bathroom in 3 days. °SEEK IMMEDIATE MEDICAL CARE IF:  °· Your pain gets worse. °· Your bloating becomes very bad. °· You have a fever or chills, and your symptoms suddenly get worse. °· You begin vomiting. °· You have bowel movements that are bloody or black. °MAKE SURE YOU: °· Understand these instructions. °· Will watch your condition. °· Will get help right away if you are not doing well or get worse. °  °This information is not intended to replace advice given to you by your health care provider. Make sure you discuss any questions you have with your health care provider. °  °Document Released: 03/26/2004 Document Revised: 07/04/2013 Document Reviewed: 05/24/2013 °Elsevier Interactive Patient Education ©2016 [employer] °Colon Polyps °Polyps are lumps of extra tissue growing inside the body. Polyps can grow in the large intestine (colon). Most colon polyps are noncancerous (benign). However, some colon polyps can become cancerous over time. Polyps that are larger than a pea may be harmful. To be safe, caregivers remove and test all polyps. °CAUSES  °Polyps form when mutations in the genes cause your cells to grow and divide even though no more tissue is needed. °RISK FACTORS °There are a number  of risk factors that can increase your chances of getting colon polyps. They include: °· Being older than 50 years. °· Family history of colon polyps or colon cancer. °· Long-term colon diseases, such as colitis or Crohn disease. °· Being overweight. °· Smoking. °· Being inactive. °· Drinking too much alcohol. °SYMPTOMS  °Most small polyps do not cause symptoms. If symptoms are present, they may include: °· Blood in the stool. The stool may look dark red or black. °· Constipation or diarrhea that lasts longer than 1 week. °DIAGNOSIS °People often do not know they have polyps until their caregiver finds them during a regular checkup. Your caregiver can use 4 tests to check for polyps: °· Digital rectal exam. The caregiver wears gloves and feels inside the rectum. This test would find polyps only in the rectum. °· Barium enema. The caregiver puts a liquid called barium into your rectum before taking X-rays of your colon. Barium makes your colon look white. Polyps are dark, so they are easy to see in the X-ray pictures. °· Sigmoidoscopy. A thin, flexible tube (sigmoidoscope) is placed into your rectum. The sigmoidoscope has a   light and tiny camera in it. The caregiver uses the sigmoidoscope to look at the last third of your colon. °· Colonoscopy. This test is like sigmoidoscopy, but the caregiver looks at the entire colon. This is the most common method for finding and removing polyps. °TREATMENT  °Any polyps will be removed during a sigmoidoscopy or colonoscopy. The polyps are then tested for cancer. °PREVENTION  °To help lower your risk of getting more colon polyps: °· Eat plenty of fruits and vegetables. Avoid eating fatty foods. °· Do not smoke. °· Avoid drinking alcohol. °· Exercise every day. °· Lose weight if recommended by your caregiver. °· Eat plenty of calcium and folate. Foods that are rich in calcium include milk, cheese, and broccoli. Foods that are rich in folate include chickpeas, kidney beans, and  spinach. °HOME CARE INSTRUCTIONS °Keep all follow-up appointments as directed by your caregiver. You may need periodic exams to check for polyps. °SEEK MEDICAL CARE IF: °You notice bleeding during a bowel movement. °  °This information is not intended to replace advice given to you by your health care provider. Make sure you discuss any questions you have with your health care provider. °  °Document Released: 03/25/2004 Document Revised: 07/20/2014 Document Reviewed: 09/08/2011 °Elsevier Interactive Patient Education ©2016 Elsevier Inc. ° °

## 2016-05-06 NOTE — Op Note (Signed)
[facility] Patient Name: Margaret Flowers Procedure Date: 05/06/2016 10:55 AM MRN: [id number] Date of Birth: 1956/05/20 Attending MD: Norvel Richards , MD CSN: [id number] Age: 60 Admit Type: Outpatient Procedure:                Colonoscopy with multiple snare polypectomies Indications:              High risk colon cancer surveillance: Personal                            history of colonic polyps Providers:                Norvel Richards, MD, Charlyne Petrin RN, RN,                            Randa Spike, Technician Referring MD:              Medicines:                Midazolam 5 mg IV, Meperidine 75 mg IV, Ondansetron                            4 mg IV Complications:            No immediate complications. Estimated Blood Loss:     Estimated blood loss was minimal. Procedure:                Pre-Anesthesia Assessment:                           - Prior to the procedure, a History and Physical                            was performed, and patient medications and                            allergies were reviewed. The patient's tolerance of                            previous anesthesia was also reviewed. The risks                            and benefits of the procedure and the sedation                            options and risks were discussed with the patient.                            All questions were answered, and informed consent                            was obtained. Prior Anticoagulants: The patient has                            taken no previous anticoagulant or antiplatelet  agents. ASA Grade Assessment: II - A patient with                            mild systemic disease. After reviewing the risks                            and benefits, the patient was deemed in                            satisfactory condition to undergo the procedure.                           After obtaining informed consent, the colonoscope                 was passed under direct vision. Throughout the                            procedure, the patient's blood pressure, pulse, and                            oxygen saturations were monitored continuously. The                            [id number] scope was introduced through                            the anus and advanced to the the cecum, identified                            by appendiceal orifice and ileocecal valve. The                            colonoscopy was performed without difficulty. The                            patient tolerated the procedure well. The quality                            of the bowel preparation was adequate. The                            ileocecal valve, appendiceal orifice, and rectum                            were photographed. The colonoscopy was performed                            without difficulty. The patient tolerated the                            procedure well. The entire colon was not well                            visualized. Scope In:  11:08:58 AM Scope Out: 11:27:32 AM Scope Withdrawal Time: 0 hours 13 minutes 44 seconds  Total Procedure Duration: 0 hours 18 minutes 34 seconds  Findings:      The perianal and digital rectal examinations were normal.      Five pedunculated polyps were found in the sigmoid colon, transverse       colon and cecum. (3) 5 mm pedunculated polyps in the cecum?"removed with       cold snare technique.(1) 4 mm polyp in the transverse colon removed with       cold snare technique. (1) 9 mm pedunculated polyp in the sigmoid segment       removed with hot snare technique.      Scattered small and large-mouthed diverticula were found in the entire       colon. The remainder of the rectal and colonic mucosa appeared normal. Impression:               - Five polyps in the sigmoid colon, in the                            transverse colon and in the cecum - removed as                             described above.                           - Diverticulosis in the entire examined colon.                           - Moderate Sedation:      Moderate (conscious) sedation was administered by the endoscopy nurse       and supervised by the endoscopist. The following parameters were       monitored: oxygen saturation, heart rate, blood pressure, respiratory       rate, EKG, adequacy of pulmonary ventilation, and response to care.       Total physician intraservice time was 25 minutes. Recommendation:           - Patient has a contact number available for                            emergencies. The signs and symptoms of potential                            delayed complications were discussed with the                            patient. Return to normal activities tomorrow.                            Written discharge instructions were provided to the                            patient.                           - Resume previous diet.                           -  Continue present medications.                           - Repeat colonoscopy date to be determined after                            pending pathology results are reviewed for                            surveillance based on pathology results.                           - Return to GI office (date not yet determined). Procedure Code(s):        --- Professional ---                           [phone], Colonoscopy, flexible; diagnostic, including                            collection of specimen(s) by brushing or washing,                            when performed (separate procedure)                           99152, Moderate sedation services provided by the                            same physician or other qualified health care                            professional performing the diagnostic or                            therapeutic service that the sedation supports,                            requiring the presence of an independent  trained                            observer to assist in the monitoring of the                            patient's level of consciousness and physiological                            status; initial 15 minutes of intraservice time,                            patient age 19 years or older                           [phone], Moderate sedation services; each additional  15 minutes intraservice time Diagnosis Code(s):        --- Professional ---                           Z86.010, Personal history of colonic polyps                           D12.5, Benign neoplasm of sigmoid colon                           D12.3, Benign neoplasm of transverse colon (hepatic                            flexure or splenic flexure)                           D12.0, Benign neoplasm of cecum                           K57.30, Diverticulosis of large intestine without                            perforation or abscess without bleeding CPT copyright 2016 [employer]. All rights reserved. The codes documented in this report are preliminary and upon coder review may  be revised to meet current compliance requirements. Cristopher Estimable. Bree Heinzelman, MD Norvel Richards, MD 05/06/2016 11:37:14 AM This report has been signed electronically. Number of Addenda: 0

## 2016-05-07 ENCOUNTER — Encounter: Payer: Self-pay | Admitting: Internal Medicine

## 2016-05-11 ENCOUNTER — Encounter (HOSPITAL_COMMUNITY): Payer: Self-pay | Admitting: Internal Medicine

## 2016-07-08 ENCOUNTER — Emergency Department (HOSPITAL_COMMUNITY): Payer: BLUE CROSS/BLUE SHIELD

## 2016-07-08 ENCOUNTER — Encounter (HOSPITAL_COMMUNITY): Payer: Self-pay | Admitting: Emergency Medicine

## 2016-07-08 ENCOUNTER — Inpatient Hospital Stay (HOSPITAL_COMMUNITY)
Admission: EM | Admit: 2016-07-08 | Discharge: 2016-07-11 | DRG: 418 | Disposition: A | Discharge status: Home / Self Care | Payer: BLUE CROSS/BLUE SHIELD | Attending: General Surgery | Admitting: General Surgery

## 2016-07-08 DIAGNOSIS — Z885 Allergy status to narcotic agent status: Secondary | ICD-10-CM | POA: Diagnosis not present

## 2016-07-08 DIAGNOSIS — Z882 Allergy status to sulfonamides status: Secondary | ICD-10-CM

## 2016-07-08 DIAGNOSIS — Z79899 Other long term (current) drug therapy: Secondary | ICD-10-CM | POA: Diagnosis not present

## 2016-07-08 DIAGNOSIS — K821 Hydrops of gallbladder: Secondary | ICD-10-CM | POA: Diagnosis not present

## 2016-07-08 DIAGNOSIS — Z888 Allergy status to other drugs, medicaments and biological substances status: Secondary | ICD-10-CM | POA: Diagnosis not present

## 2016-07-08 DIAGNOSIS — Z880 Allergy status to penicillin: Secondary | ICD-10-CM

## 2016-07-08 DIAGNOSIS — K219 Gastro-esophageal reflux disease without esophagitis: Secondary | ICD-10-CM | POA: Diagnosis present

## 2016-07-08 DIAGNOSIS — R079 Chest pain, unspecified: Secondary | ICD-10-CM | POA: Diagnosis not present

## 2016-07-08 DIAGNOSIS — Z681 Body mass index (BMI) 19 or less, adult: Secondary | ICD-10-CM | POA: Diagnosis not present

## 2016-07-08 DIAGNOSIS — Z7982 Long term (current) use of aspirin: Secondary | ICD-10-CM

## 2016-07-08 DIAGNOSIS — Z794 Long term (current) use of insulin: Secondary | ICD-10-CM

## 2016-07-08 DIAGNOSIS — E876 Hypokalemia: Secondary | ICD-10-CM | POA: Diagnosis not present

## 2016-07-08 DIAGNOSIS — K8500 Idiopathic acute pancreatitis without necrosis or infection: Secondary | ICD-10-CM | POA: Diagnosis not present

## 2016-07-08 DIAGNOSIS — R109 Unspecified abdominal pain: Secondary | ICD-10-CM

## 2016-07-08 DIAGNOSIS — I10 Essential (primary) hypertension: Secondary | ICD-10-CM | POA: Diagnosis not present

## 2016-07-08 DIAGNOSIS — N179 Acute kidney failure, unspecified: Secondary | ICD-10-CM | POA: Diagnosis not present

## 2016-07-08 DIAGNOSIS — K819 Cholecystitis, unspecified: Secondary | ICD-10-CM | POA: Diagnosis present

## 2016-07-08 DIAGNOSIS — E119 Type 2 diabetes mellitus without complications: Secondary | ICD-10-CM | POA: Diagnosis not present

## 2016-07-08 DIAGNOSIS — K810 Acute cholecystitis: Secondary | ICD-10-CM

## 2016-07-08 DIAGNOSIS — K8000 Calculus of gallbladder with acute cholecystitis without obstruction: Secondary | ICD-10-CM | POA: Diagnosis not present

## 2016-07-08 DIAGNOSIS — D72829 Elevated white blood cell count, unspecified: Secondary | ICD-10-CM | POA: Diagnosis not present

## 2016-07-08 DIAGNOSIS — K8020 Calculus of gallbladder without cholecystitis without obstruction: Secondary | ICD-10-CM | POA: Diagnosis not present

## 2016-07-08 DIAGNOSIS — E7800 Pure hypercholesterolemia, unspecified: Secondary | ICD-10-CM | POA: Diagnosis not present

## 2016-07-08 DIAGNOSIS — R1011 Right upper quadrant pain: Secondary | ICD-10-CM

## 2016-07-08 DIAGNOSIS — K8010 Calculus of gallbladder with chronic cholecystitis without obstruction: Secondary | ICD-10-CM

## 2016-07-08 DIAGNOSIS — K828 Other specified diseases of gallbladder: Secondary | ICD-10-CM | POA: Diagnosis not present

## 2016-07-08 DIAGNOSIS — Z1389 Encounter for screening for other disorder: Secondary | ICD-10-CM | POA: Diagnosis not present

## 2016-07-08 LAB — GLUCOSE, CAPILLARY: GLUCOSE-CAPILLARY: 176 mg/dL — AB (ref 65–99)

## 2016-07-08 LAB — COMPREHENSIVE METABOLIC PANEL
ALT: 23 U/L (ref 14–54)
AST: 22 U/L (ref 15–41)
Albumin: 4.1 g/dL (ref 3.5–5.0)
Alkaline Phosphatase: 60 U/L (ref 38–126)
Anion gap: 11 (ref 5–15)
BUN: 20 mg/dL (ref 6–20)
CHLORIDE: 97 mmol/L — AB (ref 101–111)
CO2: 27 mmol/L (ref 22–32)
CREATININE: 1.04 mg/dL — AB (ref 0.44–1.00)
Calcium: 9.4 mg/dL (ref 8.9–10.3)
GFR calc non Af Amer: 56 mL/min — ABNORMAL LOW (ref >60)
Glucose, Bld: 122 mg/dL — ABNORMAL HIGH (ref 65–99)
Potassium: 2.8 mmol/L — ABNORMAL LOW (ref 3.5–5.1)
SODIUM: 135 mmol/L (ref 135–145)
Total Bilirubin: 0.9 mg/dL (ref 0.3–1.2)
Total Protein: 8.3 g/dL — ABNORMAL HIGH (ref 6.5–8.1)

## 2016-07-08 LAB — CBC WITH DIFFERENTIAL/PLATELET
BASOS ABS: 0 10*3/uL (ref 0.0–0.1)
BASOS PCT: 0 %
EOS ABS: 0.2 10*3/uL (ref 0.0–0.7)
EOS PCT: 2 %
HCT: 41.3 % (ref 36.0–46.0)
HEMOGLOBIN: 14 g/dL (ref 12.0–15.0)
Lymphocytes Relative: 18 %
Lymphs Abs: 1.9 10*3/uL (ref 0.7–4.0)
MCH: 28.1 pg (ref 26.0–34.0)
MCHC: 33.9 g/dL (ref 30.0–36.0)
MCV: 82.9 fL (ref 78.0–100.0)
Monocytes Absolute: 0.8 10*3/uL (ref 0.1–1.0)
Monocytes Relative: 7 %
NEUTROS PCT: 73 %
Neutro Abs: 7.8 10*3/uL — ABNORMAL HIGH (ref 1.7–7.7)
PLATELETS: 257 10*3/uL (ref 150–400)
RBC: 4.98 MIL/uL (ref 3.87–5.11)
RDW: 14.4 % (ref 11.5–15.5)
WBC: 10.7 10*3/uL — AB (ref 4.0–10.5)

## 2016-07-08 LAB — LIPASE, BLOOD: Lipase: 44 U/L (ref 11–51)

## 2016-07-08 LAB — MAGNESIUM: MAGNESIUM: 2 mg/dL (ref 1.7–2.4)

## 2016-07-08 LAB — TROPONIN I

## 2016-07-08 MED ORDER — INSULIN ASPART 100 UNIT/ML ~~LOC~~ SOLN
0.0000 [IU] | Freq: Three times a day (TID) | SUBCUTANEOUS | Status: DC
  Administered 2016-07-09: 2 [IU] via SUBCUTANEOUS
  Administered 2016-07-09: 1 [IU] via SUBCUTANEOUS
  Administered 2016-07-10 (×2): 2 [IU] via SUBCUTANEOUS

## 2016-07-08 MED ORDER — AMLODIPINE BESYLATE 5 MG PO TABS
10.0000 mg | ORAL_TABLET | Freq: Every evening | ORAL | Status: DC
  Administered 2016-07-09 – 2016-07-10 (×2): 10 mg via ORAL
  Filled 2016-07-08 (×2): qty 2

## 2016-07-08 MED ORDER — ASPIRIN EC 81 MG PO TBEC
81.0000 mg | DELAYED_RELEASE_TABLET | Freq: Every day | ORAL | Status: DC
  Administered 2016-07-09: 81 mg via ORAL
  Filled 2016-07-08 (×2): qty 1

## 2016-07-08 MED ORDER — HYDROMORPHONE HCL 1 MG/ML IJ SOLN
0.5000 mg | Freq: Four times a day (QID) | INTRAMUSCULAR | Status: DC | PRN
  Administered 2016-07-09: 0.5 mg via INTRAVENOUS
  Filled 2016-07-08 (×2): qty 1

## 2016-07-08 MED ORDER — MORPHINE SULFATE (PF) 4 MG/ML IV SOLN
4.0000 mg | INTRAVENOUS | Status: AC | PRN
  Administered 2016-07-08 (×2): 4 mg via INTRAVENOUS
  Filled 2016-07-08 (×2): qty 1

## 2016-07-08 MED ORDER — PANTOPRAZOLE SODIUM 40 MG PO TBEC
40.0000 mg | DELAYED_RELEASE_TABLET | Freq: Every day | ORAL | Status: DC
  Administered 2016-07-09 – 2016-07-11 (×2): 40 mg via ORAL
  Filled 2016-07-08 (×3): qty 1

## 2016-07-08 MED ORDER — ONDANSETRON HCL 4 MG/5ML PO SOLN
ORAL | Status: AC
  Filled 2016-07-08: qty 1

## 2016-07-08 MED ORDER — FAMOTIDINE IN NACL 20-0.9 MG/50ML-% IV SOLN
20.0000 mg | Freq: Once | INTRAVENOUS | Status: AC
  Administered 2016-07-08: 20 mg via INTRAVENOUS
  Filled 2016-07-08: qty 50

## 2016-07-08 MED ORDER — HYDROCHLOROTHIAZIDE 12.5 MG PO CAPS
12.5000 mg | ORAL_CAPSULE | Freq: Every day | ORAL | Status: DC
  Administered 2016-07-09 – 2016-07-11 (×2): 12.5 mg via ORAL
  Filled 2016-07-08 (×2): qty 1

## 2016-07-08 MED ORDER — IOPAMIDOL (ISOVUE-300) INJECTION 61%
100.0000 mL | Freq: Once | INTRAVENOUS | Status: AC | PRN
  Administered 2016-07-08: 100 mL via INTRAVENOUS

## 2016-07-08 MED ORDER — ACETAMINOPHEN 650 MG RE SUPP: 650.0000 mg | Freq: Four times a day (QID) | RECTAL | Status: DC | PRN

## 2016-07-08 MED ORDER — POTASSIUM CHLORIDE CRYS ER 20 MEQ PO TBCR
20.0000 meq | EXTENDED_RELEASE_TABLET | Freq: Two times a day (BID) | ORAL | Status: AC
  Administered 2016-07-08 – 2016-07-09 (×3): 20 meq via ORAL
  Filled 2016-07-08 (×3): qty 1

## 2016-07-08 MED ORDER — ONDANSETRON HCL 4 MG/2ML IJ SOLN
INTRAMUSCULAR | Status: AC
  Filled 2016-07-08: qty 2

## 2016-07-08 MED ORDER — ACETAMINOPHEN 325 MG PO TABS: 650.0000 mg | ORAL_TABLET | Freq: Four times a day (QID) | ORAL | Status: DC | PRN

## 2016-07-08 MED ORDER — ONDANSETRON HCL 4 MG/2ML IJ SOLN
4.0000 mg | Freq: Four times a day (QID) | INTRAMUSCULAR | Status: DC | PRN
Start: 2016-07-08 — End: 2016-07-11
  Administered 2016-07-08 – 2016-07-09 (×2): 4 mg via INTRAVENOUS
  Filled 2016-07-08 (×3): qty 2

## 2016-07-08 MED ORDER — SODIUM CHLORIDE 0.9 % IV SOLN
INTRAVENOUS | Status: DC
  Administered 2016-07-08: 22:00:00 via INTRAVENOUS

## 2016-07-08 MED ORDER — DEXTROSE 5 % IV SOLN
2.0000 g | Freq: Once | INTRAVENOUS | Status: AC
  Administered 2016-07-08: 2 g via INTRAVENOUS
  Filled 2016-07-08: qty 2

## 2016-07-08 MED ORDER — IOPAMIDOL (ISOVUE-300) INJECTION 61%
INTRAVENOUS | Status: AC
  Filled 2016-07-08: qty 30

## 2016-07-08 MED ORDER — ONDANSETRON HCL 4 MG/2ML IJ SOLN
4.0000 mg | INTRAMUSCULAR | Status: AC | PRN
  Administered 2016-07-08 (×2): 4 mg via INTRAVENOUS
  Filled 2016-07-08 (×2): qty 2

## 2016-07-08 MED ORDER — POTASSIUM CHLORIDE 10 MEQ/100ML IV SOLN
10.0000 meq | INTRAVENOUS | Status: AC
Start: 2016-07-08 — End: 2016-07-08
  Administered 2016-07-08 (×2): 10 meq via INTRAVENOUS
  Filled 2016-07-08 (×2): qty 100

## 2016-07-08 NOTE — H&P (Addendum)
TRH H&P   Patient Demographics:    Margaret Flowers, is a 60 y.o. female  MRN: [id number]   DOB - [birth year]  Admit Date - 07/08/2016  Outpatient Primary MD for the patient is Bureau  Referring MD/NP/PA: Margette Fast   Outpatient Specialists:     Patient coming from: [facility] Complaint  Patient presents with  . Abdominal Pain  . Chest Pain      HPI:    Margaret Flowers  is a 60 y.o. female, w hx of glucose intolerance, gerd apparently c/o epigastric discomfort starting on this past Sunday.  Pt continued to have pain over the next several days and had another episode this am which prompted her to go to see [place].  Pt states took some ibuprofen today which helped the pain.  Pt doesn't relate pain to food intake.  Pt denies fever, chills, emesis, diarrhea, constipation, brbpr, black stool.  Pt ha had some nausea.    In ED, pt was noted to have normal liver function and lipase and CT scan abd / pelvis =. Possible cholecystitis.  ED consulted surgery who will be by in the am to evaluate the patient per ED.  Pt given rocephin iv x 1 in the ED.  Pt will be admitted for epigastric pain possibly secondary to cholecystitis.      Review of systems:    In addition to the HPI above,  No Fever-chills, No Headache, No changes with Vision or hearing, No problems swallowing food or Liquids, No Chest pain, Cough or Shortness of Breath, No Vommitting, Bowel movements are regular, No Blood in stool or Urine, No dysuria, No new skin rashes or bruises, No new joints pains-aches,  No new weakness, tingling, numbness in any extremity, No recent weight gain or loss, No polyuria, polydypsia or polyphagia, No significant Mental Stressors.  A full 10 point Review of Systems was done, except as stated above, all other Review of Systems were  negative.   With Past History of the following :    Past Medical History:  Diagnosis Date  . Diabetes mellitus without complication (HCC)    prediabetic  . Elevated cholesterol   . GERD (gastroesophageal reflux disease)   . Hypertension   . S/P colonoscopy 2002, 2007   2002: tubular adenomas; 2007: internal hemorrhoids, tubular adenoma      Past Surgical History:  Procedure Laterality Date  . COLONOSCOPY N/A 05/06/2016   Procedure: COLONOSCOPY;  Surgeon: Daneil Dolin, MD;  Location: [facility];  Service: Endoscopy;  Laterality: N/A;  11:30 AM  . None        Social History:     Social History  Substance Use Topics  . Smoking status: Never Smoker  . Smokeless tobacco: Never Used  . Alcohol use No     Lives - at home  Mobility -  walks without assistance  Family History :     Family History  Problem Relation Age of Onset  . Kidney cancer Mother     living  . Skin cancer Mother   . Breast cancer Mother   . Breast cancer Sister     living  . Pernicious anemia Father     deceased  . Pancreatic cancer Brother     passed away age 19  . Hypertension Son   . Alzheimer's disease Maternal Grandmother   . Parkinson's disease Maternal Grandfather   . Fibromyalgia Sister   . Hypertension Son   . Parkinson's disease Maternal Uncle      Home Medications:   Prior to Admission medications   Medication Sig Start Date End Date Taking? Authorizing Provider  amLODipine (NORVASC) 10 MG tablet Take 10 mg by mouth every evening.   Yes Historical Provider, MD  aspirin EC 81 MG tablet Take 81 mg by mouth daily.   Yes Historical Provider, MD  Cholecalciferol (VITAMIN D3) 5000 UNITS CAPS Take 5,000 Units by mouth daily.    Yes Historical Provider, MD  hydrochlorothiazide (MICROZIDE) 12.5 MG capsule Take 12.5 mg by mouth daily.   Yes Historical Provider, MD  Nutritional Supplements (NUTRITIONAL SUPPLEMENT PLUS) LIQD Take 1 Bottle by mouth daily.   Yes Historical Provider, MD   Omega-3 Fatty Acids (FISH OIL) 1200 MG CAPS Take 1,200 mg by mouth daily.   Yes Historical Provider, MD  omeprazole (PRILOSEC) 20 MG capsule Take 1 capsule (20 mg total) by mouth daily. 01/03/16  Yes Estill Dooms, NP     Allergies:     Allergies  Allergen Reactions  . Benadryl [Diphenhydramine Hcl] Other (See Comments)    Reaction:  Hyperactivity   . Codeine Other (See Comments)    Reaction:  Hyperactivity   . Amoxicillin Rash and Other (See Comments)    Has patient had a PCN reaction causing immediate rash, facial/tongue/throat swelling, SOB or lightheadedness with hypotension: No Has patient had a PCN reaction causing severe rash involving mucus membranes or skin necrosis: No Has patient had a PCN reaction that required hospitalization: No Has patient had a PCN reaction occurring within the last 10 years: No If all of the above answers are "NO", then may proceed with Cephalosporin use.   . Sulfa Antibiotics Rash     Physical Exam:   Vitals  Blood pressure 104/63, pulse 86, temperature 98 F (36.7 C), temperature source Oral, resp. rate 19, height 5' (1.524 m), weight 68 kg (150 lb), SpO2 98 %.   1. General lying in bed in NAD,    2. Normal affect and insight, Not Suicidal or Homicidal, Awake Alert, Oriented X 3.  3. No F.N deficits, ALL C.Nerves Intact, Strength 5/5 all 4 extremities, Sensation intact all 4 extremities, Plantars down going.  4. Ears and Eyes appear Normal, Conjunctivae clear, PERRLA. Moist Oral Mucosa.  5. Supple Neck, No JVD, No cervical lymphadenopathy appriciated, No Carotid Bruits.  6. Symmetrical Chest wall movement, Good air movement bilaterally, CTAB.  7. RRR, No Gallops, Rubs or Murmurs, No Parasternal Heave.  8. Positive Bowel Sounds, Abdomen Soft, No tenderness, negative murphy sign  No organomegaly appriciated,No rebound -guarding or rigidity.  9.  No Cyanosis, Normal Skin Turgor, No Skin Rash or Bruise.  10. Good muscle tone,   joints appear normal , no effusions, Normal ROM.  11. No Palpable Lymph Nodes in Neck or Axillae     Data Review:    CBC  Recent Labs Lab 07/08/16 1655  WBC 10.7*  HGB 14.0  HCT 41.3  PLT 257  MCV 82.9  MCH 28.1  MCHC 33.9  RDW 14.4  LYMPHSABS 1.9  MONOABS 0.8  EOSABS 0.2  BASOSABS 0.0   ------------------------------------------------------------------------------------------------------------------  Chemistries   Recent Labs Lab 07/08/16 1655  NA 135  K 2.8*  CL 97*  CO2 27  GLUCOSE 122*  BUN 20  CREATININE 1.04*  CALCIUM 9.4  AST 22  ALT 23  ALKPHOS 60  BILITOT 0.9   ------------------------------------------------------------------------------------------------------------------ estimated creatinine clearance is 48.3 mL/min (by C-G formula based on SCr of 1.04 mg/dL (H)). ------------------------------------------------------------------------------------------------------------------ No results for input(s): TSH, T4TOTAL, T3FREE, THYROIDAB in the last 72 hours.  Invalid input(s): FREET3  Coagulation profile No results for input(s): INR, PROTIME in the last 168 hours. ------------------------------------------------------------------------------------------------------------------- No results for input(s): DDIMER in the last 72 hours. -------------------------------------------------------------------------------------------------------------------  Cardiac Enzymes  Recent Labs Lab 07/08/16 1649  TROPONINI <0.03   ------------------------------------------------------------------------------------------------------------------ No results found for: BNP   ---------------------------------------------------------------------------------------------------------------  Urinalysis No results found for: COLORURINE, APPEARANCEUR, LABSPEC, PHURINE, GLUCOSEU, HGBUR, BILIRUBINUR, KETONESUR, PROTEINUR, UROBILINOGEN, NITRITE,  LEUKOCYTESUR  ----------------------------------------------------------------------------------------------------------------   Imaging Results:    Dg Chest 2 View  Result Date: 07/08/2016 CLINICAL DATA:  Low central chest pain EXAM: CHEST  2 VIEW COMPARISON:  None. FINDINGS: The lungs are clear wiithout focal pneumonia, edema, pneumothorax or pleural effusion. Interstitial markings are diffusely coarsened with chronic features. The cardiopericardial silhouette is within normal limits for size. The visualized bony structures of the thorax are intact. Telemetry leads overlie the chest. IMPRESSION: No active cardiopulmonary disease. Electronically Signed   By: Misty Stanley M.D.   On: 07/08/2016 18:52   Ct Abdomen Pelvis W Contrast  Result Date: 07/08/2016 CLINICAL DATA:  Upper abdominal pain radiating into the back with nausea today. No other complaints. History of diabetes. EXAM: CT ABDOMEN AND PELVIS WITH CONTRAST TECHNIQUE: Multidetector CT imaging of the abdomen and pelvis was performed using the standard protocol following bolus administration of intravenous contrast. CONTRAST:  135mL ISOVUE-300 IOPAMIDOL (ISOVUE-300) INJECTION 61% COMPARISON:  None. FINDINGS: Lower chest: The lung bases are essentially clear without suspicious findings. No significant pleural or pericardial effusion. Hepatobiliary: The liver appears unremarkable. The gallbladder is distended. There are 2 calcified gallstones within the gallbladder neck. No significant gallbladder wall thickening is appreciated, although there is mild soft tissue stranding around the gallbladder. Appearance is concerning for possible cystic duct obstruction and acute cholecystitis. No significant biliary dilatation. Pancreas: Unremarkable. No pancreatic ductal dilatation or surrounding inflammatory changes. Spleen: Normal in size without focal abnormality. Adrenals/Urinary Tract: Both adrenal glands appear normal. Probable tiny left renal cysts.  The kidneys otherwise appear normal. No evidence of urinary tract calculus or hydronephrosis. The bladder appears unremarkable. Stomach/Bowel: No evidence of bowel wall thickening, distention or surrounding inflammatory change. The appendix appears normal. Vascular/Lymphatic: There are no enlarged abdominal or pelvic lymph nodes. Minimal aortic and branch vessel atherosclerosis. Reproductive: The uterus and ovaries appear normal. No evidence of adnexal mass or pelvic inflammatory process. Other: Tiny umbilical hernia containing only fat.  No ascites. Musculoskeletal: No acute or significant osseous findings. Chronic degenerative disc disease at L5-S1. IMPRESSION: 1. Gallbladder distention with surrounding soft tissue stranding and small calcified gallstones at the gallbladder neck. Findings are suspicious for cystic duct obstruction and acute cholecystitis. Correlate clinically. Ultrasound may be contributory, although nuclear medicine hepatobiliary scan would be the most definitive study. 2. No other acute or significant findings. The appendix appears normal. Electronically  Signed   By: Richardean Sale M.D.   On: 07/08/2016 19:28      Assessment & Plan:    Active Problems:   Diabetes ([place])   Abdominal pain   Hypokalemia   Cholecystitis    1. Epigatric pain protonix 40mg  qday NPO except for meds Surgery consult, appreciate input RUQ ultrasound Consider HIDA  Hypokalemia Replete Check cmp  Glucose intolerance.  Check hga1c   DVT Prophylaxis SCDs   AM Labs Ordered, also please review Full Orders  Family Communication: Admission, patients condition and plan of care including tests being ordered have been discussed with the patient  who indicate understanding and agree with the plan and Code Status.  Code Status FULL CODE  Likely DC to  home  Condition GUARDED    Consults called: general surgery by ED  Admission status: inpatient  Time spent in minutes : 45 minutes   Jani Gravel M.D on 07/08/2016 at 8:39 PM  Between 7am to 7pm - Pager - [phone]  After 7pm go to [email or web address] - password [facility]  [facility] - Office  [phone]

## 2016-07-08 NOTE — ED Provider Notes (Signed)
[place] DEPT Provider Note   CSN: [id number] Arrival date & time: 07/08/16  1640     History   Chief Complaint Chief Complaint  Patient presents with  . Abdominal Pain  . Chest Pain    HPI Margaret Flowers is a 60 y.o. female.  HPI  Pt was seen at 1705.  Per pt, c/o gradual onset and persistence of constant upper abd "pain" for the past 3 days.  Has been associated with nausea.  Describes the abd pain as "aching" with radiation into her back. States the pain worsens approximately 4 hours after eating.  Denies vomiting/diarrhea, no fevers, no back pain, no rash, no CP/SOB, no black or blood in stools.      Past Medical History:  Diagnosis Date  . Diabetes mellitus without complication (HCC)    prediabetic  . Elevated cholesterol   . GERD (gastroesophageal reflux disease)   . Hypertension   . S/P colonoscopy 2002, 2007   2002: tubular adenomas; 2007: internal hemorrhoids, tubular adenoma    Patient Active Problem List   Diagnosis Date Noted  . Hypertension 12/15/2012  . Diabetes ([place]) 12/15/2012  . GERD (gastroesophageal reflux disease) 11/28/2010  . History of colon polyps 11/28/2010    Past Surgical History:  Procedure Laterality Date  . COLONOSCOPY N/A 05/06/2016   Procedure: COLONOSCOPY;  Surgeon: Daneil Dolin, MD;  Location: [facility];  Service: Endoscopy;  Laterality: N/A;  11:30 AM  . None      OB History    Gravida Para Term Preterm AB Living   3 3           SAB TAB Ectopic Multiple Live Births                   Home Medications    Prior to Admission medications   Medication Sig Start Date End Date Taking? Authorizing Provider  amLODipine (NORVASC) 10 MG tablet Take 1 tablet (10 mg total) by mouth daily. Patient taking differently: Take 10 mg by mouth every evening.  01/03/16   Estill Dooms, NP  aspirin 81 MG tablet Take 81 mg by mouth daily.    Historical Provider, MD  Cholecalciferol (VITAMIN D3) 5000 UNITS CAPS Take 5,000  Units by mouth daily.     Historical Provider, MD  hydrochlorothiazide (MICROZIDE) 12.5 MG capsule TAKE ONE CAPSULE BY MOUTH ONCE DAILY 03/17/16   Estill Dooms, NP  Omega 3 1200 MG CAPS Take 1,200 mg by mouth daily.    Historical Provider, MD  omeprazole (PRILOSEC) 20 MG capsule Take 1 capsule (20 mg total) by mouth daily. 01/03/16   Estill Dooms, NP  OVER THE COUNTER MEDICATION Take by mouth daily. Multivitamin drink once a day    Historical Provider, MD  Turmeric 500 MG CAPS Take 500 mg by mouth every evening.     Historical Provider, MD    Family History Family History  Problem Relation Age of Onset  . Kidney cancer Mother     living  . Skin cancer Mother   . Breast cancer Mother   . Breast cancer Sister     living  . Pernicious anemia Father     deceased  . Pancreatic cancer Brother     passed away age 22  . Hypertension Son   . Alzheimer's disease Maternal Grandmother   . Parkinson's disease Maternal Grandfather   . Fibromyalgia Sister   . Hypertension Son   . Parkinson's disease Maternal Uncle  Social History Social History  Substance Use Topics  . Smoking status: Never Smoker  . Smokeless tobacco: Never Used  . Alcohol use No     Allergies   Benadryl [diphenhydramine hcl]; Codeine; Amoxicillin; and Sulfa antibiotics   Review of Systems Review of Systems ROS: Statement: All systems negative except as marked or noted in the HPI; Constitutional: Negative for fever and chills. ; ; Eyes: Negative for eye pain, redness and discharge. ; ; ENMT: Negative for ear pain, hoarseness, nasal congestion, sinus pressure and sore throat. ; ; Cardiovascular: Negative for chest pain, palpitations, diaphoresis, dyspnea and peripheral edema. ; ; Respiratory: Negative for cough, wheezing and stridor. ; ; Gastrointestinal: +nausea, abd pain. Negative for vomiting, diarrhea, blood in stool, hematemesis, jaundice and rectal bleeding. . ; ; Genitourinary: Negative for dysuria,  flank pain and hematuria. ; ; Musculoskeletal: Negative for back pain and neck pain. Negative for swelling and trauma.; ; Skin: Negative for pruritus, rash, abrasions, blisters, bruising and skin lesion.; ; Neuro: Negative for headache, lightheadedness and neck stiffness. Negative for weakness, altered level of consciousness, altered mental status, extremity weakness, paresthesias, involuntary movement, seizure and syncope.       Physical Exam Updated Vital Signs BP 142/88 (BP Location: Right Arm)   Pulse 82   Temp 98 F (36.7 C) (Oral)   Resp 17   Ht 5' (1.524 m)   Wt 150 lb (68 kg)   SpO2 98%   BMI 29.29 kg/m   Physical Exam 1710: Physical examination:  Nursing notes reviewed; Vital signs and O2 SAT reviewed;  Constitutional: Well developed, Well nourished, Well hydrated, In no acute distress; Head:  Normocephalic, atraumatic; Eyes: EOMI, PERRL, No scleral icterus; ENMT: Mouth and pharynx normal, Mucous membranes moist; Neck: Supple, Full range of motion, No lymphadenopathy; Cardiovascular: Regular rate and rhythm, No gallop; Respiratory: Breath sounds clear & equal bilaterally, No wheezes.  Speaking full sentences with ease, Normal respiratory effort/excursion; Chest: Nontender, Movement normal; Abdomen: Soft, +RUQ, mid-epigastric areas tender to palp. No rebound or guarding. Nondistended, Normal bowel sounds; Genitourinary: No CVA tenderness; Extremities: Pulses normal, No tenderness, No edema, No calf edema or asymmetry.; Neuro: AA&Ox3, Major CN grossly intact.  Speech clear. No gross focal motor or sensory deficits in extremities.; Skin: Color normal, Warm, Dry.   ED Treatments / Results  Labs (all labs ordered are listed, but only abnormal results are displayed)   EKG  EKG Interpretation  Date/Time:  Wednesday July 08 2016 16:58:27 EST Ventricular Rate:  89 PR Interval:    QRS Duration: 96 QT Interval:  382 QTC Calculation: 465 R Axis:   14 Text Interpretation:  Sinus  rhythm No old tracing to compare Confirmed by [facility]  MD, Nunzio Cory [phone]) on 07/08/2016 6:05:05 PM       Radiology No results found.  Procedures Procedures (including critical care time)  Medications Ordered in ED Medications  ondansetron (ZOFRAN) injection 4 mg (4 mg Intravenous Given 07/08/16 1727)  iopamidol (ISOVUE-300) 61 % injection (not administered)  potassium chloride 10 mEq in 100 mL IVPB (10 mEq Intravenous New Bag/Given 07/08/16 1813)  famotidine (PEPCID) IVPB 20 mg premix (0 mg Intravenous Stopped 07/08/16 1804)     Initial Impression / Assessment and Plan / ED Course  I have reviewed the triage vital signs and the nursing notes.  Pertinent labs & imaging results that were available during my care of the patient were reviewed by me and considered in my medical decision making (see chart for details).  MDM Reviewed: previous chart, nursing note and vitals Reviewed previous: labs and ECG Interpretation: labs, x-ray, ECG and CT scan    Results for orders placed or performed during the hospital encounter of 07/08/16  Troponin I  Result Value Ref Range   Troponin I <0.03 <0.03 ng/mL  Comprehensive metabolic panel  Result Value Ref Range   Sodium 135 135 - 145 mmol/L   Potassium 2.8 (L) 3.5 - 5.1 mmol/L   Chloride 97 (L) 101 - 111 mmol/L   CO2 27 22 - 32 mmol/L   Glucose, Bld 122 (H) 65 - 99 mg/dL   BUN 20 6 - 20 mg/dL   Creatinine, Ser 1.04 (H) 0.44 - 1.00 mg/dL   Calcium 9.4 8.9 - 10.3 mg/dL   Total Protein 8.3 (H) 6.5 - 8.1 g/dL   Albumin 4.1 3.5 - 5.0 g/dL   AST 22 15 - 41 U/L   ALT 23 14 - 54 U/L   Alkaline Phosphatase 60 38 - 126 U/L   Total Bilirubin 0.9 0.3 - 1.2 mg/dL   GFR calc non Af Amer 56 (L) >60 mL/min   GFR calc Af Amer >60 >60 mL/min   Anion gap 11 5 - 15  Lipase, blood  Result Value Ref Range   Lipase 44 11 - 51 U/L  CBC with Differential  Result Value Ref Range   WBC 10.7 (H) 4.0 - 10.5 K/uL   RBC 4.98 3.87 - 5.11 MIL/uL    Hemoglobin 14.0 12.0 - 15.0 g/dL   HCT 41.3 36.0 - 46.0 %   MCV 82.9 78.0 - 100.0 fL   MCH 28.1 26.0 - 34.0 pg   MCHC 33.9 30.0 - 36.0 g/dL   RDW 14.4 11.5 - 15.5 %   Platelets 257 150 - 400 K/uL   Neutrophils Relative % 73 %   Neutro Abs 7.8 (H) 1.7 - 7.7 K/uL   Lymphocytes Relative 18 %   Lymphs Abs 1.9 0.7 - 4.0 K/uL   Monocytes Relative 7 %   Monocytes Absolute 0.8 0.1 - 1.0 K/uL   Eosinophils Relative 2 %   Eosinophils Absolute 0.2 0.0 - 0.7 K/uL   Basophils Relative 0 %   Basophils Absolute 0.0 0.0 - 0.1 K/uL   Dg Chest 2 View Result Date: 07/08/2016 CLINICAL DATA:  Low central chest pain EXAM: CHEST  2 VIEW COMPARISON:  None. FINDINGS: The lungs are clear wiithout focal pneumonia, edema, pneumothorax or pleural effusion. Interstitial markings are diffusely coarsened with chronic features. The cardiopericardial silhouette is within normal limits for size. The visualized bony structures of the thorax are intact. Telemetry leads overlie the chest. IMPRESSION: No active cardiopulmonary disease. Electronically Signed   By: Misty Stanley M.D.   On: 07/08/2016 18:52   Ct Abdomen Pelvis W Contrast Result Date: 07/08/2016 CLINICAL DATA:  Upper abdominal pain radiating into the back with nausea today. No other complaints. History of diabetes. EXAM: CT ABDOMEN AND PELVIS WITH CONTRAST TECHNIQUE: Multidetector CT imaging of the abdomen and pelvis was performed using the standard protocol following bolus administration of intravenous contrast. CONTRAST:  131mL ISOVUE-300 IOPAMIDOL (ISOVUE-300) INJECTION 61% COMPARISON:  None. FINDINGS: Lower chest: The lung bases are essentially clear without suspicious findings. No significant pleural or pericardial effusion. Hepatobiliary: The liver appears unremarkable. The gallbladder is distended. There are 2 calcified gallstones within the gallbladder neck. No significant gallbladder wall thickening is appreciated, although there is mild soft tissue stranding  around the gallbladder. Appearance is concerning for possible cystic  duct obstruction and acute cholecystitis. No significant biliary dilatation. Pancreas: Unremarkable. No pancreatic ductal dilatation or surrounding inflammatory changes. Spleen: Normal in size without focal abnormality. Adrenals/Urinary Tract: Both adrenal glands appear normal. Probable tiny left renal cysts. The kidneys otherwise appear normal. No evidence of urinary tract calculus or hydronephrosis. The bladder appears unremarkable. Stomach/Bowel: No evidence of bowel wall thickening, distention or surrounding inflammatory change. The appendix appears normal. Vascular/Lymphatic: There are no enlarged abdominal or pelvic lymph nodes. Minimal aortic and branch vessel atherosclerosis. Reproductive: The uterus and ovaries appear normal. No evidence of adnexal mass or pelvic inflammatory process. Other: Tiny umbilical hernia containing only fat.  No ascites. Musculoskeletal: No acute or significant osseous findings. Chronic degenerative disc disease at L5-S1. IMPRESSION: 1. Gallbladder distention with surrounding soft tissue stranding and small calcified gallstones at the gallbladder neck. Findings are suspicious for cystic duct obstruction and acute cholecystitis. Correlate clinically. Ultrasound may be contributory, although nuclear medicine hepatobiliary scan would be the most definitive study. 2. No other acute or significant findings. The appendix appears normal. Electronically Signed   By: Richardean Sale M.D.   On: 07/08/2016 19:28    1945:  CT scan as above; IV abx started. Potassium repleted IV. Dx and testing d/w pt and family.  Questions answered.  Verb understanding, agreeable to admit.  T/C to General Surgery Dr. Arnoldo Morale, case discussed, including:  HPI, pertinent PM/SHx, VS/PE, dx testing, ED course and treatment:  Agreeable to consult, requests to admit to [facility].  T/C to [facility] Dr. Maudie Mercury, case discussed, including:  HPI, pertinent  PM/SHx, VS/PE, dx testing, ED course and treatment:  Agreeable to admit, requests he will come to ED for evaluation.    Final Clinical Impressions(s) / ED Diagnoses   Final diagnoses:  None    New Prescriptions New Prescriptions   No medications on file     Francine Graven, DO 07/10/16 1850

## 2016-07-08 NOTE — ED Triage Notes (Signed)
Patient complains of upper abdominal pain that radiate towards back. States some nausea, denies vomiting.

## 2016-07-09 ENCOUNTER — Observation Stay (HOSPITAL_COMMUNITY): Payer: BLUE CROSS/BLUE SHIELD

## 2016-07-09 DIAGNOSIS — K819 Cholecystitis, unspecified: Secondary | ICD-10-CM

## 2016-07-09 DIAGNOSIS — E876 Hypokalemia: Secondary | ICD-10-CM | POA: Diagnosis not present

## 2016-07-09 DIAGNOSIS — N179 Acute kidney failure, unspecified: Secondary | ICD-10-CM | POA: Diagnosis not present

## 2016-07-09 DIAGNOSIS — K8020 Calculus of gallbladder without cholecystitis without obstruction: Secondary | ICD-10-CM | POA: Diagnosis not present

## 2016-07-09 DIAGNOSIS — D72829 Elevated white blood cell count, unspecified: Secondary | ICD-10-CM

## 2016-07-09 LAB — COMPREHENSIVE METABOLIC PANEL
ALK PHOS: 89 U/L (ref 38–126)
ALT: 124 U/L — AB (ref 14–54)
AST: 172 U/L — AB (ref 15–41)
Albumin: 3.5 g/dL (ref 3.5–5.0)
Anion gap: 7 (ref 5–15)
BILIRUBIN TOTAL: 1.3 mg/dL — AB (ref 0.3–1.2)
BUN: 10 mg/dL (ref 6–20)
CALCIUM: 8.8 mg/dL — AB (ref 8.9–10.3)
CO2: 28 mmol/L (ref 22–32)
CREATININE: 0.78 mg/dL (ref 0.44–1.00)
Chloride: 100 mmol/L — ABNORMAL LOW (ref 101–111)
GFR calc Af Amer: >60 mL/min (ref >60)
Glucose, Bld: 134 mg/dL — ABNORMAL HIGH (ref 65–99)
Potassium: 4 mmol/L (ref 3.5–5.1)
Sodium: 135 mmol/L (ref 135–145)
TOTAL PROTEIN: 7.2 g/dL (ref 6.5–8.1)

## 2016-07-09 LAB — GLUCOSE, CAPILLARY
GLUCOSE-CAPILLARY: 107 mg/dL — AB (ref 65–99)
GLUCOSE-CAPILLARY: 134 mg/dL — AB (ref 65–99)
GLUCOSE-CAPILLARY: 157 mg/dL — AB (ref 65–99)
GLUCOSE-CAPILLARY: 172 mg/dL — AB (ref 65–99)
Glucose-Capillary: 145 mg/dL — ABNORMAL HIGH (ref 65–99)
Glucose-Capillary: 148 mg/dL — ABNORMAL HIGH (ref 65–99)

## 2016-07-09 LAB — CBC
HCT: 39.1 % (ref 36.0–46.0)
Hemoglobin: 13.4 g/dL (ref 12.0–15.0)
MCH: 28.7 pg (ref 26.0–34.0)
MCHC: 34.3 g/dL (ref 30.0–36.0)
MCV: 83.7 fL (ref 78.0–100.0)
PLATELETS: 219 10*3/uL (ref 150–400)
RBC: 4.67 MIL/uL (ref 3.87–5.11)
RDW: 14.3 % (ref 11.5–15.5)
WBC: 10.1 10*3/uL (ref 4.0–10.5)

## 2016-07-09 MED ORDER — CHLORHEXIDINE GLUCONATE CLOTH 2 % EX PADS
6.0000 | MEDICATED_PAD | Freq: Once | CUTANEOUS | Status: AC
  Administered 2016-07-10: 6 via TOPICAL

## 2016-07-09 MED ORDER — CHLORHEXIDINE GLUCONATE CLOTH 2 % EX PADS
6.0000 | MEDICATED_PAD | Freq: Once | CUTANEOUS | Status: AC
  Administered 2016-07-09: 6 via TOPICAL

## 2016-07-09 MED ORDER — KCL IN DEXTROSE-NACL 20-5-0.45 MEQ/L-%-% IV SOLN
INTRAVENOUS | Status: DC
  Administered 2016-07-09 (×2): via INTRAVENOUS

## 2016-07-09 MED ORDER — HYDROMORPHONE HCL 1 MG/ML IJ SOLN
0.5000 mg | Freq: Once | INTRAMUSCULAR | Status: AC
  Administered 2016-07-09: 0.5 mg via INTRAVENOUS

## 2016-07-09 MED ORDER — HYDROMORPHONE HCL 1 MG/ML IJ SOLN
1.0000 mg | INTRAMUSCULAR | Status: DC | PRN
  Administered 2016-07-09 – 2016-07-10 (×8): 1 mg via INTRAVENOUS
  Filled 2016-07-09 (×8): qty 1

## 2016-07-09 NOTE — Progress Notes (Signed)
Patient complains of abdominal pain, received dilaudid 0.5mg  at 0330, next dose due 0930. Dr. Charlies Silvers notified, new order for dilaudid 0.5mg  times one dose.

## 2016-07-09 NOTE — Consult Note (Signed)
Reason for Consult: Acute cholecystitis, cholelithiasis Referring Physician: Dr. Harmon Dun is an 60 y.o. female.  HPI: Patient is a 60 year old white female who presented emergency room with a three-day history of worsening right upper quadrant abdominal pain, nausea, and vomiting. She states that her pain was 8 out of 10. She denies any fever, chills, or jaundice. She states she has had multiple episodes of right upper quadrant abdominal pain over the past 6 months, but the symptoms resolved on the run. She denies any fatty food intolerance. CT scan the abdomen was performed which revealed acute cholecystitis. The patient was also noted to be hypokalemic. She was admitted to the hospital for further evaluation and treatment. This morning, she is still having some right upper quadrant abdominal pain requiring IV Dilaudid. She is supposed to undergo an ultrasound early this morning.  Past Medical History:  Diagnosis Date  . Diabetes mellitus without complication (HCC)    prediabetic  . Elevated cholesterol   . GERD (gastroesophageal reflux disease)   . Hypertension   . S/P colonoscopy 2002, 2007   2002: tubular adenomas; 2007: internal hemorrhoids, tubular adenoma    Past Surgical History:  Procedure Laterality Date  . COLONOSCOPY N/A 05/06/2016   Procedure: COLONOSCOPY;  Surgeon: Daneil Dolin, MD;  Location: [facility];  Service: Endoscopy;  Laterality: N/A;  11:30 AM  . None      Family History  Problem Relation Age of Onset  . Kidney cancer Mother     living  . Skin cancer Mother   . Breast cancer Mother   . Breast cancer Sister     living  . Pernicious anemia Father     deceased  . Pancreatic cancer Brother     passed away age 60  . Hypertension Son   . Alzheimer's disease Maternal Grandmother   . Parkinson's disease Maternal Grandfather   . Fibromyalgia Sister   . Hypertension Son   . Parkinson's disease Maternal Uncle     Social History:  reports  that she has never smoked. She has never used smokeless tobacco. She reports that she does not drink alcohol or use drugs.  Allergies:  Allergies  Allergen Reactions  . Benadryl [Diphenhydramine Hcl] Other (See Comments)    Reaction:  Hyperactivity   . Codeine Other (See Comments)    Reaction:  Hyperactivity   . Amoxicillin Rash and Other (See Comments)    Has patient had a PCN reaction causing immediate rash, facial/tongue/throat swelling, SOB or lightheadedness with hypotension: No Has patient had a PCN reaction causing severe rash involving mucus membranes or skin necrosis: No Has patient had a PCN reaction that required hospitalization: No Has patient had a PCN reaction occurring within the last 10 years: No If all of the above answers are "NO", then may proceed with Cephalosporin use.   . Sulfa Antibiotics Rash    Medications:  Prior to Admission:  Prescriptions Prior to Admission  Medication Sig Dispense Refill Last Dose  . amLODipine (NORVASC) 10 MG tablet Take 10 mg by mouth every evening.   07/07/2016 at Unknown time  . aspirin EC 81 MG tablet Take 81 mg by mouth daily.   07/08/2016 at 0600  . Cholecalciferol (VITAMIN D3) 5000 UNITS CAPS Take 5,000 Units by mouth daily.    07/08/2016 at Unknown time  . hydrochlorothiazide (MICROZIDE) 12.5 MG capsule Take 12.5 mg by mouth daily.   07/08/2016 at Unknown time  . Nutritional Supplements (NUTRITIONAL SUPPLEMENT PLUS)  LIQD Take 1 Bottle by mouth daily.   07/08/2016 at Unknown time  . Omega-3 Fatty Acids (FISH OIL) 1200 MG CAPS Take 1,200 mg by mouth daily.   07/08/2016 at Unknown time  . omeprazole (PRILOSEC) 20 MG capsule Take 1 capsule (20 mg total) by mouth daily. 90 capsule 4 07/08/2016 at Unknown time   Scheduled: . amLODipine  10 mg Oral QPM  . aspirin EC  81 mg Oral Daily  . hydrochlorothiazide  12.5 mg Oral Daily  . insulin aspart  0-9 Units Subcutaneous TID WC  . ondansetron      . ondansetron      . pantoprazole  40  mg Oral Daily  . potassium chloride  20 mEq Oral BID    Results for orders placed or performed during the hospital encounter of 07/08/16 (from the past 48 hour(s))  Troponin I     Status: None   Collection Time: 07/08/16  4:49 PM  Result Value Ref Range   Troponin I <0.03 <0.03 ng/mL  Comprehensive metabolic panel     Status: Abnormal   Collection Time: 07/08/16  4:55 PM  Result Value Ref Range   Sodium 135 135 - 145 mmol/L   Potassium 2.8 (L) 3.5 - 5.1 mmol/L   Chloride 97 (L) 101 - 111 mmol/L   CO2 27 22 - 32 mmol/L   Glucose, Bld 122 (H) 65 - 99 mg/dL   BUN 20 6 - 20 mg/dL   Creatinine, Ser 1.04 (H) 0.44 - 1.00 mg/dL   Calcium 9.4 8.9 - 10.3 mg/dL   Total Protein 8.3 (H) 6.5 - 8.1 g/dL   Albumin 4.1 3.5 - 5.0 g/dL   AST 22 15 - 41 U/L   ALT 23 14 - 54 U/L   Alkaline Phosphatase 60 38 - 126 U/L   Total Bilirubin 0.9 0.3 - 1.2 mg/dL   GFR calc non Af Amer 56 (L) >60 mL/min   GFR calc Af Amer >60 >60 mL/min    Comment: (NOTE) The eGFR has been calculated using the CKD EPI equation. This calculation has not been validated in all clinical situations. eGFR's persistently <60 mL/min signify possible Chronic Kidney Disease.    Anion gap 11 5 - 15  Lipase, blood     Status: None   Collection Time: 07/08/16  4:55 PM  Result Value Ref Range   Lipase 44 11 - 51 U/L  CBC with Differential     Status: Abnormal   Collection Time: 07/08/16  4:55 PM  Result Value Ref Range   WBC 10.7 (H) 4.0 - 10.5 K/uL   RBC 4.98 3.87 - 5.11 MIL/uL   Hemoglobin 14.0 12.0 - 15.0 g/dL   HCT 41.3 36.0 - 46.0 %   MCV 82.9 78.0 - 100.0 fL   MCH 28.1 26.0 - 34.0 pg   MCHC 33.9 30.0 - 36.0 g/dL   RDW 14.4 11.5 - 15.5 %   Platelets 257 150 - 400 K/uL   Neutrophils Relative % 73 %   Neutro Abs 7.8 (H) 1.7 - 7.7 K/uL   Lymphocytes Relative 18 %   Lymphs Abs 1.9 0.7 - 4.0 K/uL   Monocytes Relative 7 %   Monocytes Absolute 0.8 0.1 - 1.0 K/uL   Eosinophils Relative 2 %   Eosinophils Absolute 0.2 0.0 -  0.7 K/uL   Basophils Relative 0 %   Basophils Absolute 0.0 0.0 - 0.1 K/uL  Magnesium     Status: None   Collection Time:  07/08/16  6:17 PM  Result Value Ref Range   Magnesium 2.0 1.7 - 2.4 mg/dL  Glucose, capillary     Status: Abnormal   Collection Time: 07/08/16  9:38 PM  Result Value Ref Range   Glucose-Capillary 176 (H) 65 - 99 mg/dL  Glucose, capillary     Status: Abnormal   Collection Time: 07/09/16 12:38 AM  Result Value Ref Range   Glucose-Capillary 157 (H) 65 - 99 mg/dL  Glucose, capillary     Status: Abnormal   Collection Time: 07/09/16  4:29 AM  Result Value Ref Range   Glucose-Capillary 134 (H) 65 - 99 mg/dL  Comprehensive metabolic panel     Status: Abnormal   Collection Time: 07/09/16  6:13 AM  Result Value Ref Range   Sodium 135 135 - 145 mmol/L   Potassium 4.0 3.5 - 5.1 mmol/L    Comment: DELTA CHECK NOTED   Chloride 100 (L) 101 - 111 mmol/L   CO2 28 22 - 32 mmol/L   Glucose, Bld 134 (H) 65 - 99 mg/dL   BUN 10 6 - 20 mg/dL   Creatinine, Ser 0.78 0.44 - 1.00 mg/dL   Calcium 8.8 (L) 8.9 - 10.3 mg/dL   Total Protein 7.2 6.5 - 8.1 g/dL   Albumin 3.5 3.5 - 5.0 g/dL   AST 172 (H) 15 - 41 U/L   ALT 124 (H) 14 - 54 U/L   Alkaline Phosphatase 89 38 - 126 U/L   Total Bilirubin 1.3 (H) 0.3 - 1.2 mg/dL   GFR calc non Af Amer >60 >60 mL/min   GFR calc Af Amer >60 >60 mL/min    Comment: (NOTE) The eGFR has been calculated using the CKD EPI equation. This calculation has not been validated in all clinical situations. eGFR's persistently <60 mL/min signify possible Chronic Kidney Disease.    Anion gap 7 5 - 15  CBC     Status: None   Collection Time: 07/09/16  6:13 AM  Result Value Ref Range   WBC 10.1 4.0 - 10.5 K/uL   RBC 4.67 3.87 - 5.11 MIL/uL   Hemoglobin 13.4 12.0 - 15.0 g/dL   HCT 39.1 36.0 - 46.0 %   MCV 83.7 78.0 - 100.0 fL   MCH 28.7 26.0 - 34.0 pg   MCHC 34.3 30.0 - 36.0 g/dL   RDW 14.3 11.5 - 15.5 %   Platelets 219 150 - 400 K/uL  Glucose,  capillary     Status: Abnormal   Collection Time: 07/09/16  7:44 AM  Result Value Ref Range   Glucose-Capillary 107 (H) 65 - 99 mg/dL    Dg Chest 2 View  Result Date: 07/08/2016 CLINICAL DATA:  Low central chest pain EXAM: CHEST  2 VIEW COMPARISON:  None. FINDINGS: The lungs are clear wiithout focal pneumonia, edema, pneumothorax or pleural effusion. Interstitial markings are diffusely coarsened with chronic features. The cardiopericardial silhouette is within normal limits for size. The visualized bony structures of the thorax are intact. Telemetry leads overlie the chest. IMPRESSION: No active cardiopulmonary disease. Electronically Signed   By: Misty Stanley M.D.   On: 07/08/2016 18:52   Ct Abdomen Pelvis W Contrast  Result Date: 07/08/2016 CLINICAL DATA:  Upper abdominal pain radiating into the back with nausea today. No other complaints. History of diabetes. EXAM: CT ABDOMEN AND PELVIS WITH CONTRAST TECHNIQUE: Multidetector CT imaging of the abdomen and pelvis was performed using the standard protocol following bolus administration of intravenous contrast. CONTRAST:  153m ISOVUE-300  IOPAMIDOL (ISOVUE-300) INJECTION 61% COMPARISON:  None. FINDINGS: Lower chest: The lung bases are essentially clear without suspicious findings. No significant pleural or pericardial effusion. Hepatobiliary: The liver appears unremarkable. The gallbladder is distended. There are 2 calcified gallstones within the gallbladder neck. No significant gallbladder wall thickening is appreciated, although there is mild soft tissue stranding around the gallbladder. Appearance is concerning for possible cystic duct obstruction and acute cholecystitis. No significant biliary dilatation. Pancreas: Unremarkable. No pancreatic ductal dilatation or surrounding inflammatory changes. Spleen: Normal in size without focal abnormality. Adrenals/Urinary Tract: Both adrenal glands appear normal. Probable tiny left renal cysts. The kidneys  otherwise appear normal. No evidence of urinary tract calculus or hydronephrosis. The bladder appears unremarkable. Stomach/Bowel: No evidence of bowel wall thickening, distention or surrounding inflammatory change. The appendix appears normal. Vascular/Lymphatic: There are no enlarged abdominal or pelvic lymph nodes. Minimal aortic and branch vessel atherosclerosis. Reproductive: The uterus and ovaries appear normal. No evidence of adnexal mass or pelvic inflammatory process. Other: Tiny umbilical hernia containing only fat.  No ascites. Musculoskeletal: No acute or significant osseous findings. Chronic degenerative disc disease at L5-S1. IMPRESSION: 1. Gallbladder distention with surrounding soft tissue stranding and small calcified gallstones at the gallbladder neck. Findings are suspicious for cystic duct obstruction and acute cholecystitis. Correlate clinically. Ultrasound may be contributory, although nuclear medicine hepatobiliary scan would be the most definitive study. 2. No other acute or significant findings. The appendix appears normal. Electronically Signed   By: Richardean Sale M.D.   On: 07/08/2016 19:28    ROS:  Pertinent items noted in HPI and remainder of comprehensive ROS otherwise negative.  Blood pressure 120/63, pulse 100, temperature 98.1 F (36.7 C), temperature source Oral, resp. rate 18, height 5' (1.524 m), weight 65.8 kg (145 lb 1.6 oz), SpO2 99 %. Physical Exam: Pleasant well-developed well-nourished white female who is in mild discomfort. Head is normocephalic, atraumatic. Eyes reveal no scleral icterus. Lungs clear to auscultation with equal breath sounds bilaterally. Neck is supple without JVD. Heart examination reveals a regular rate and rhythm without S3, S4, murmurs. Abdomen is soft with tenderness noted right upper quadrant to palpation. No hepatosplenomegaly, masses, hernias are identified. No rigidity is noted. CT scan report reviewed. Hospitalist H & P  reviewed.  Assessment/Plan: Acute cholecystitis, cholelithiasis. Liver enzyme tests are elevated today. Ultrasound is pending. I have adjusted her IV fluid. Her potassium has normalized. Will proceed with laparoscopic cholecystectomy with possible cholangiograms tomorrow pending her ultrasound report. The risks and benefits of the procedure including bleeding, infection, hepatobiliary injury, and the possibility of an open procedure were fully explained to the patient, who gave informed consent.  Candler Ginsberg A 07/09/2016, 8:08 AM

## 2016-07-09 NOTE — Progress Notes (Signed)
Patient ID: Margaret Flowers, female   DOB: [birth year], 60 y.o.   MRN: [id number]  PROGRESS NOTE    Margaret Flowers  [id number] DOB: 06/19/1956 DOA: 07/08/2016  PCP: [facility]   Brief Narrative:  60 year old female with past medical history significant for hypertension, dyslipidemia presented to hospital with worsening right upper quadrant abdominal pain, nausea and associated vomiting. Patient reported sharp, ongoing pain, 8 out of 10 at worst in intensity. No associated fevers or chills or jaundice. Patient has had multiple episodes of right upper quadrant abdominal pain over past 6 months prior to this admission which have spontaneously resolved. On admission, patient was hemodynamically stable. CT abdomen showed gallbladder distention with surrounding soft tissue stranding and small calcified gallstones in the gallbladder neck, findings suspicious for cystic duct obstruction and acute cholecystitis. Patient was seen by surgery in consultation and plan is for cholecystectomy tomorrow morning.   Assessment & Plan:    Active Problems: RUQ abdominal / Acute cholecystitis / Leukocytosis  - CT abdomen showed gallbladder distention with surrounding soft tissue stranding and small calcified gallstones in the gallbladder neck, findings suspicious for cystic duct obstruction and acute cholecystitis. - Plan for surgery tomorrow am - Continue IV fluids - Continue pain management efforts   Hypokalemia - Due to GI losses - Supplemented - Now WNL  AKI - Likely prerenal - Improved with IV fluids    DVT prophylaxis: SCDs bilaterally Code Status: full code  Family Communication:  Husband at bedside Disposition Plan: Plan for surgery tomorrow   Consultants:   Surgery   Procedures:   None   Antimicrobials:   None     Subjective: Pain little better this am.   Objective: Vitals:   07/08/16 1759 07/08/16 2030 07/08/16 2124 07/09/16 0400  BP: 142/88  104/63 113/65 120/63  Pulse: 82 86 79 100  Resp: 17 19 17 18   Temp:   97.4 F (36.3 C) 98.1 F (36.7 C)  TempSrc:   Oral Oral  SpO2: 98% 98% 98% 99%  Weight:   66.4 kg (146 lb 6.4 oz) 65.8 kg (145 lb 1.6 oz)  Height:   5' (1.524 m)     Intake/Output Summary (Last 24 hours) at 07/09/16 1026 Last data filed at 07/09/16 0957  Gross per 24 hour  Intake              360 ml  Output              250 ml  Net              110 ml   Filed Weights   07/08/16 1650 07/08/16 2124 07/09/16 0400  Weight: 68 kg (150 lb) 66.4 kg (146 lb 6.4 oz) 65.8 kg (145 lb 1.6 oz)    Examination:  General exam: Appears calm and comfortable  Respiratory system: Clear to auscultation. Respiratory effort normal. Cardiovascular system: S1 & S2 heard, RRR. No JVD, murmurs, rubs, gallops or clicks. No pedal edema. Gastrointestinal system: Abdomen is nondistended, soft and nontender. No organomegaly or masses felt. Normal bowel sounds heard. Central nervous system: Alert and oriented. No focal neurological deficits. Extremities: Symmetric 5 x 5 power. Skin: No rashes, lesions or ulcers Psychiatry: Judgement and insight appear normal. Mood & affect appropriate.   Data Reviewed: I have personally reviewed following labs and imaging studies  CBC:  Recent Labs Lab 07/08/16 1655 07/09/16 0613  WBC 10.7* 10.1  NEUTROABS 7.8*  --   HGB 14.0 13.4  HCT 41.3 39.1  MCV 82.9 83.7  PLT 257 [id number]   Basic Metabolic Panel:  Recent Labs Lab 07/08/16 1655 07/08/16 1817 07/09/16 0613  NA 135  --  135  K 2.8*  --  4.0  CL 97*  --  100*  CO2 27  --  28  GLUCOSE 122*  --  134*  BUN 20  --  10  CREATININE 1.04*  --  0.78  CALCIUM 9.4  --  8.8*  MG  --  2.0  --    GFR: Estimated Creatinine Clearance: 61.7 mL/min (by C-G formula based on SCr of 0.78 mg/dL). Liver Function Tests:  Recent Labs Lab 07/08/16 1655 07/09/16 0613  AST 22 172*  ALT 23 124*  ALKPHOS 60 89  BILITOT 0.9 1.3*  PROT 8.3* 7.2    ALBUMIN 4.1 3.5    Recent Labs Lab 07/08/16 1655  LIPASE 44   No results for input(s): AMMONIA in the last 168 hours. Coagulation Profile: No results for input(s): INR, PROTIME in the last 168 hours. Cardiac Enzymes:  Recent Labs Lab 07/08/16 1649  TROPONINI <0.03   BNP (last 3 results) No results for input(s): PROBNP in the last 8760 hours. HbA1C: No results for input(s): HGBA1C in the last 72 hours. CBG:  Recent Labs Lab 07/08/16 2138 07/09/16 0038 07/09/16 0429 07/09/16 0744  GLUCAP 176* 157* 134* 107*   Lipid Profile: No results for input(s): CHOL, HDL, LDLCALC, TRIG, CHOLHDL, LDLDIRECT in the last 72 hours. Thyroid Function Tests: No results for input(s): TSH, T4TOTAL, FREET4, T3FREE, THYROIDAB in the last 72 hours. Anemia Panel: No results for input(s): VITAMINB12, FOLATE, FERRITIN, TIBC, IRON, RETICCTPCT in the last 72 hours. Urine analysis: No results found for: COLORURINE, APPEARANCEUR, LABSPEC, PHURINE, GLUCOSEU, HGBUR, BILIRUBINUR, KETONESUR, PROTEINUR, UROBILINOGEN, NITRITE, LEUKOCYTESUR Sepsis Labs: @LABRCNTIP (procalcitonin:4,lacticidven:4)   )No results found for this or any previous visit (from the past 240 hour(s)).    Radiology Studies: Dg Chest 2 View Result Date: 07/08/2016 No active cardiopulmonary disease.    Ct Abdomen Pelvis W Contrast Result Date: 12/27/20171. Gallbladder distention with surrounding soft tissue stranding and small calcified gallstones at the gallbladder neck. Findings are suspicious for cystic duct obstruction and acute cholecystitis. Correlate clinically. Ultrasound may be contributory, although nuclear medicine hepatobiliary scan would be the most definitive study. 2. No other acute or significant findings. The appendix appears normal.   US Abdomen Limited Ruq Result Date: 07/09/2016 Apparent gallstone adherent in the neck of the gallbladder. Mild sludge in gallbladder. No gallbladder wall thickening. Gallbladder  does appear mildly distended, however. Relative increase in liver echogenicity most likely indicates a degree of hepatic steatosis. While no focal liver lesions are evident, it must be cautioned that the sensitivity of ultrasound for detection of focal liver lesions is diminished in this circumstance.     Scheduled Meds: . amLODipine  10 mg Oral QPM  . aspirin EC  81 mg Oral Daily  . hydrochlorothiazide  12.5 mg Oral Daily  . insulin aspart  0-9 Units Subcutaneous TID WC  . pantoprazole  40 mg Oral Daily  . potassium chloride  20 mEq Oral BID   Continuous Infusions: . dextrose 5 % and 0.45 % NaCl with KCl 20 mEq/L       LOS: 0 days    Time spent: 25 minutes  Greater than 50% of the time spent on counseling and coordinating the care.   Leisa Lenz, MD [facility] Pager [phone]  If 7PM-7AM, please contact night-coverage [email or web address]  Password [email or web address] 07/09/2016, 10:26 AM

## 2016-07-10 ENCOUNTER — Encounter (HOSPITAL_COMMUNITY)
Admission: EM | Disposition: A | Discharge status: Home / Self Care | Payer: Self-pay | Source: Home / Self Care | Attending: Internal Medicine

## 2016-07-10 ENCOUNTER — Observation Stay (HOSPITAL_COMMUNITY): Payer: BLUE CROSS/BLUE SHIELD | Admitting: Anesthesiology

## 2016-07-10 ENCOUNTER — Encounter (HOSPITAL_COMMUNITY): Payer: Self-pay | Admitting: *Deleted

## 2016-07-10 DIAGNOSIS — Z885 Allergy status to narcotic agent status: Secondary | ICD-10-CM | POA: Diagnosis not present

## 2016-07-10 DIAGNOSIS — K819 Cholecystitis, unspecified: Secondary | ICD-10-CM | POA: Diagnosis not present

## 2016-07-10 DIAGNOSIS — K8000 Calculus of gallbladder with acute cholecystitis without obstruction: Secondary | ICD-10-CM | POA: Diagnosis not present

## 2016-07-10 DIAGNOSIS — E119 Type 2 diabetes mellitus without complications: Secondary | ICD-10-CM | POA: Diagnosis present

## 2016-07-10 DIAGNOSIS — K8020 Calculus of gallbladder without cholecystitis without obstruction: Secondary | ICD-10-CM | POA: Diagnosis not present

## 2016-07-10 DIAGNOSIS — K810 Acute cholecystitis: Secondary | ICD-10-CM | POA: Diagnosis not present

## 2016-07-10 DIAGNOSIS — E876 Hypokalemia: Secondary | ICD-10-CM | POA: Diagnosis not present

## 2016-07-10 DIAGNOSIS — I10 Essential (primary) hypertension: Secondary | ICD-10-CM | POA: Diagnosis not present

## 2016-07-10 DIAGNOSIS — Z882 Allergy status to sulfonamides status: Secondary | ICD-10-CM | POA: Diagnosis not present

## 2016-07-10 DIAGNOSIS — K821 Hydrops of gallbladder: Secondary | ICD-10-CM | POA: Diagnosis present

## 2016-07-10 DIAGNOSIS — E7800 Pure hypercholesterolemia, unspecified: Secondary | ICD-10-CM | POA: Diagnosis present

## 2016-07-10 DIAGNOSIS — Z7982 Long term (current) use of aspirin: Secondary | ICD-10-CM | POA: Diagnosis not present

## 2016-07-10 DIAGNOSIS — Z888 Allergy status to other drugs, medicaments and biological substances status: Secondary | ICD-10-CM | POA: Diagnosis not present

## 2016-07-10 DIAGNOSIS — Z880 Allergy status to penicillin: Secondary | ICD-10-CM | POA: Diagnosis not present

## 2016-07-10 DIAGNOSIS — N179 Acute kidney failure, unspecified: Secondary | ICD-10-CM | POA: Diagnosis present

## 2016-07-10 DIAGNOSIS — K219 Gastro-esophageal reflux disease without esophagitis: Secondary | ICD-10-CM | POA: Diagnosis present

## 2016-07-10 DIAGNOSIS — Z794 Long term (current) use of insulin: Secondary | ICD-10-CM | POA: Diagnosis not present

## 2016-07-10 DIAGNOSIS — Z79899 Other long term (current) drug therapy: Secondary | ICD-10-CM | POA: Diagnosis not present

## 2016-07-10 HISTORY — PX: CHOLECYSTECTOMY: SHX55

## 2016-07-10 LAB — CBC
HEMATOCRIT: 41.7 % (ref 36.0–46.0)
HEMOGLOBIN: 13.7 g/dL (ref 12.0–15.0)
MCH: 27.5 pg (ref 26.0–34.0)
MCHC: 32.9 g/dL (ref 30.0–36.0)
MCV: 83.6 fL (ref 78.0–100.0)
Platelets: 322 10*3/uL (ref 150–400)
RBC: 4.99 MIL/uL (ref 3.87–5.11)
RDW: 14.6 % (ref 11.5–15.5)
WBC: 13.8 10*3/uL — ABNORMAL HIGH (ref 4.0–10.5)

## 2016-07-10 LAB — HEPATIC FUNCTION PANEL
ALT: 110 U/L — AB (ref 14–54)
AST: 94 U/L — AB (ref 15–41)
Albumin: 3.7 g/dL (ref 3.5–5.0)
Alkaline Phosphatase: 91 U/L (ref 38–126)
Bilirubin, Direct: 0.4 mg/dL (ref 0.1–0.5)
Indirect Bilirubin: 1.1 mg/dL — ABNORMAL HIGH (ref 0.3–0.9)
TOTAL PROTEIN: 8.1 g/dL (ref 6.5–8.1)
Total Bilirubin: 1.5 mg/dL — ABNORMAL HIGH (ref 0.3–1.2)

## 2016-07-10 LAB — BASIC METABOLIC PANEL
Anion gap: 9 (ref 5–15)
BUN: 7 mg/dL (ref 6–20)
CHLORIDE: 96 mmol/L — AB (ref 101–111)
CO2: 27 mmol/L (ref 22–32)
CREATININE: 0.82 mg/dL (ref 0.44–1.00)
Calcium: 9.3 mg/dL (ref 8.9–10.3)
GFR calc non Af Amer: >60 mL/min (ref >60)
Glucose, Bld: 146 mg/dL — ABNORMAL HIGH (ref 65–99)
POTASSIUM: 4 mmol/L (ref 3.5–5.1)
Sodium: 132 mmol/L — ABNORMAL LOW (ref 135–145)

## 2016-07-10 LAB — HEMOGLOBIN A1C
Hgb A1c MFr Bld: 5.8 % — ABNORMAL HIGH (ref 4.8–5.6)
MEAN PLASMA GLUCOSE: 120 mg/dL

## 2016-07-10 LAB — GLUCOSE, CAPILLARY
GLUCOSE-CAPILLARY: 148 mg/dL — AB (ref 65–99)
GLUCOSE-CAPILLARY: 118 mg/dL — AB (ref 65–99)
GLUCOSE-CAPILLARY: 129 mg/dL — AB (ref 65–99)
Glucose-Capillary: 158 mg/dL — ABNORMAL HIGH (ref 65–99)
Glucose-Capillary: 161 mg/dL — ABNORMAL HIGH (ref 65–99)
Glucose-Capillary: 176 mg/dL — ABNORMAL HIGH (ref 65–99)

## 2016-07-10 LAB — SURGICAL PCR SCREEN
MRSA, PCR: NEGATIVE
STAPHYLOCOCCUS AUREUS: NEGATIVE

## 2016-07-10 SURGERY — LAPAROSCOPIC CHOLECYSTECTOMY WITH INTRAOPERATIVE CHOLANGIOGRAM
Anesthesia: General | Site: Abdomen

## 2016-07-10 MED ORDER — FENTANYL CITRATE (PF) 100 MCG/2ML IJ SOLN
INTRAMUSCULAR | Status: AC
  Filled 2016-07-10: qty 2

## 2016-07-10 MED ORDER — CIPROFLOXACIN IN D5W 400 MG/200ML IV SOLN
400.0000 mg | Freq: Two times a day (BID) | INTRAVENOUS | Status: DC
  Administered 2016-07-10: 400 mg via INTRAVENOUS
  Filled 2016-07-10 (×2): qty 200

## 2016-07-10 MED ORDER — MIDAZOLAM HCL 2 MG/2ML IJ SOLN
INTRAMUSCULAR | Status: AC
  Filled 2016-07-10: qty 2

## 2016-07-10 MED ORDER — ROCURONIUM BROMIDE 50 MG/5ML IV SOLN
INTRAVENOUS | Status: AC
  Filled 2016-07-10: qty 1

## 2016-07-10 MED ORDER — LIDOCAINE HCL (PF) 1 % IJ SOLN
INTRAMUSCULAR | Status: AC
  Filled 2016-07-10: qty 10

## 2016-07-10 MED ORDER — KETOROLAC TROMETHAMINE 30 MG/ML IJ SOLN: 30.0000 mg | Freq: Once | INTRAMUSCULAR | Status: DC

## 2016-07-10 MED ORDER — POVIDONE-IODINE 10 % OINT PACKET
TOPICAL_OINTMENT | CUTANEOUS | Status: DC | PRN
  Administered 2016-07-10: 1 via TOPICAL

## 2016-07-10 MED ORDER — ONDANSETRON HCL 4 MG/2ML IJ SOLN
INTRAMUSCULAR | Status: AC
  Filled 2016-07-10: qty 2

## 2016-07-10 MED ORDER — LORAZEPAM 2 MG/ML IJ SOLN: 1.0000 mg | INTRAMUSCULAR | Status: DC | PRN

## 2016-07-10 MED ORDER — TRAMADOL HCL 50 MG PO TABS
50.0000 mg | ORAL_TABLET | Freq: Four times a day (QID) | ORAL | Status: DC | PRN
  Administered 2016-07-11: 50 mg via ORAL
  Filled 2016-07-10: qty 1

## 2016-07-10 MED ORDER — GLYCOPYRROLATE 0.2 MG/ML IJ SOLN
INTRAMUSCULAR | Status: DC | PRN
  Administered 2016-07-10: .6 mg via INTRAVENOUS

## 2016-07-10 MED ORDER — LACTATED RINGERS IV SOLN
INTRAVENOUS | Status: DC
  Administered 2016-07-10: 09:00:00 via INTRAVENOUS
  Administered 2016-07-10: 1000 mL via INTRAVENOUS

## 2016-07-10 MED ORDER — FENTANYL CITRATE (PF) 100 MCG/2ML IJ SOLN
INTRAMUSCULAR | Status: DC | PRN
  Administered 2016-07-10 (×5): 50 ug via INTRAVENOUS

## 2016-07-10 MED ORDER — ONDANSETRON HCL 4 MG/2ML IJ SOLN
4.0000 mg | Freq: Once | INTRAMUSCULAR | Status: AC
  Administered 2016-07-10: 4 mg via INTRAVENOUS

## 2016-07-10 MED ORDER — FENTANYL CITRATE (PF) 100 MCG/2ML IJ SOLN
25.0000 ug | INTRAMUSCULAR | Status: DC | PRN
  Administered 2016-07-10: 25 ug via INTRAVENOUS

## 2016-07-10 MED ORDER — ENOXAPARIN SODIUM 40 MG/0.4ML ~~LOC~~ SOLN: 40.0000 mg | SUBCUTANEOUS | Status: DC

## 2016-07-10 MED ORDER — MIDAZOLAM HCL 2 MG/2ML IJ SOLN
1.0000 mg | INTRAMUSCULAR | Status: DC | PRN
  Administered 2016-07-10: 2 mg via INTRAVENOUS

## 2016-07-10 MED ORDER — NEOSTIGMINE METHYLSULFATE 10 MG/10ML IV SOLN
INTRAVENOUS | Status: DC | PRN
  Administered 2016-07-10: 3.5 mg via INTRAVENOUS

## 2016-07-10 MED ORDER — POVIDONE-IODINE 10 % EX OINT
TOPICAL_OINTMENT | CUTANEOUS | Status: AC
  Filled 2016-07-10: qty 1

## 2016-07-10 MED ORDER — SIMETHICONE 80 MG PO CHEW: 40.0000 mg | CHEWABLE_TABLET | Freq: Four times a day (QID) | ORAL | Status: DC | PRN

## 2016-07-10 MED ORDER — DEXAMETHASONE SODIUM PHOSPHATE 4 MG/ML IJ SOLN
INTRAMUSCULAR | Status: AC
  Filled 2016-07-10: qty 1

## 2016-07-10 MED ORDER — SODIUM CHLORIDE 0.9 % IV SOLN
INTRAVENOUS | Status: DC
  Administered 2016-07-10 – 2016-07-11 (×2): via INTRAVENOUS

## 2016-07-10 MED ORDER — CIPROFLOXACIN IN D5W 400 MG/200ML IV SOLN
INTRAVENOUS | Status: AC
  Filled 2016-07-10: qty 200

## 2016-07-10 MED ORDER — BUPIVACAINE HCL (PF) 0.5 % IJ SOLN
INTRAMUSCULAR | Status: DC | PRN
  Administered 2016-07-10: 10 mL

## 2016-07-10 MED ORDER — ROCURONIUM 10MG/ML (10ML) SYRINGE FOR MEDFUSION PUMP - OPTIME
INTRAVENOUS | Status: DC | PRN
  Administered 2016-07-10: 24 mg via INTRAVENOUS
  Administered 2016-07-10: 6 mg via INTRAVENOUS

## 2016-07-10 MED ORDER — DEXAMETHASONE SODIUM PHOSPHATE 4 MG/ML IJ SOLN
4.0000 mg | INTRAMUSCULAR | Status: AC
  Administered 2016-07-10: 4 mg via INTRAVENOUS

## 2016-07-10 MED ORDER — BUPIVACAINE HCL (PF) 0.5 % IJ SOLN
INTRAMUSCULAR | Status: AC
  Filled 2016-07-10: qty 30

## 2016-07-10 MED ORDER — HEMOSTATIC AGENTS (NO CHARGE) OPTIME
TOPICAL | Status: DC | PRN
  Administered 2016-07-10: 1 via TOPICAL

## 2016-07-10 MED ORDER — CIPROFLOXACIN IN D5W 400 MG/200ML IV SOLN
INTRAVENOUS | Status: DC | PRN
  Administered 2016-07-10: 400 mg via INTRAVENOUS

## 2016-07-10 MED ORDER — GLYCOPYRROLATE 0.2 MG/ML IJ SOLN
INTRAMUSCULAR | Status: AC
  Filled 2016-07-10: qty 3

## 2016-07-10 MED ORDER — SUCCINYLCHOLINE CHLORIDE 20 MG/ML IJ SOLN
INTRAMUSCULAR | Status: AC
  Filled 2016-07-10: qty 1

## 2016-07-10 MED ORDER — HYDROMORPHONE HCL 1 MG/ML IJ SOLN: 0.2500 mg | INTRAMUSCULAR | Status: DC | PRN

## 2016-07-10 MED ORDER — SUCCINYLCHOLINE 20MG/ML (10ML) SYRINGE FOR MEDFUSION PUMP - OPTIME
INTRAMUSCULAR | Status: DC | PRN
  Administered 2016-07-10: 100 mg via INTRAVENOUS

## 2016-07-10 MED ORDER — PROPOFOL 10 MG/ML IV BOLUS
INTRAVENOUS | Status: DC | PRN
  Administered 2016-07-10: 130 mg via INTRAVENOUS

## 2016-07-10 MED ORDER — 0.9 % SODIUM CHLORIDE (POUR BTL) OPTIME
TOPICAL | Status: DC | PRN
  Administered 2016-07-10: 1000 mL

## 2016-07-10 MED ORDER — LIDOCAINE HCL (CARDIAC) 10 MG/ML IV SOLN
INTRAVENOUS | Status: DC | PRN
  Administered 2016-07-10: 40 mg via INTRAVENOUS

## 2016-07-10 SURGICAL SUPPLY — 51 items
APL SRG 38 LTWT LNG FL B (MISCELLANEOUS) ×1
APPLICATOR ARISTA FLEXITIP XL (MISCELLANEOUS) ×1 IMPLANT
APPLIER CLIP LAPSCP 10X32 DD (CLIP) ×2 IMPLANT
BAG HAMPER (MISCELLANEOUS) ×2 IMPLANT
BAG SPEC RTRVL LRG 6X4 10 (ENDOMECHANICALS) ×1
CHLORAPREP W/TINT 26ML (MISCELLANEOUS) ×2 IMPLANT
CLOTH BEACON ORANGE TIMEOUT ST (SAFETY) ×2 IMPLANT
COVER LIGHT HANDLE STERIS (MISCELLANEOUS) ×4 IMPLANT
DECANTER SPIKE VIAL GLASS SM (MISCELLANEOUS) ×4 IMPLANT
ELECT REM PT RETURN 9FT ADLT (ELECTROSURGICAL) ×2
ELECTRODE REM PT RTRN 9FT ADLT (ELECTROSURGICAL) ×1 IMPLANT
FILTER SMOKE EVAC LAPAROSHD (FILTER) ×2 IMPLANT
FORMALIN 10 PREFIL 120ML (MISCELLANEOUS) ×2 IMPLANT
GLOVE BIOGEL PI IND STRL 7.0 (GLOVE) ×1 IMPLANT
GLOVE BIOGEL PI IND STRL 7.5 (GLOVE) ×1 IMPLANT
GLOVE BIOGEL PI IND STRL 8 (GLOVE) IMPLANT
GLOVE BIOGEL PI INDICATOR 7.0 (GLOVE) ×1
GLOVE BIOGEL PI INDICATOR 7.5 (GLOVE) ×1
GLOVE BIOGEL PI INDICATOR 8 (GLOVE) ×1
GLOVE ECLIPSE 6.5 STRL STRAW (GLOVE) ×1 IMPLANT
GLOVE ECLIPSE 7.0 STRL STRAW (GLOVE) ×1 IMPLANT
GLOVE SURG SS PI 7.5 STRL IVOR (GLOVE) ×4 IMPLANT
GOWN STRL REUS W/ TWL LRG LVL3 (GOWN DISPOSABLE) ×2 IMPLANT
GOWN STRL REUS W/ TWL XL LVL3 (GOWN DISPOSABLE) ×1 IMPLANT
GOWN STRL REUS W/TWL LRG LVL3 (GOWN DISPOSABLE) ×4
GOWN STRL REUS W/TWL XL LVL3 (GOWN DISPOSABLE) ×2
HEMOSTAT ARISTA ABSORB 3G PWDR (MISCELLANEOUS) ×1 IMPLANT
HEMOSTAT SNOW SURGICEL 2X4 (HEMOSTASIS) ×2 IMPLANT
INST SET LAPROSCOPIC AP (KITS) ×2 IMPLANT
KIT ROOM TURNOVER APOR (KITS) ×2 IMPLANT
MANIFOLD NEPTUNE II (INSTRUMENTS) ×2 IMPLANT
NEEDLE INSUFFLATION 120MM (ENDOMECHANICALS) ×2 IMPLANT
NS IRRIG 1000ML POUR BTL (IV SOLUTION) ×2 IMPLANT
PACK LAP CHOLE LZT030E (CUSTOM PROCEDURE TRAY) ×2 IMPLANT
PAD ARMBOARD 7.5X6 YLW CONV (MISCELLANEOUS) ×2 IMPLANT
POUCH SPECIMEN RETRIEVAL 10MM (ENDOMECHANICALS) ×2 IMPLANT
SET BASIN LINEN APH (SET/KITS/TRAYS/PACK) ×2 IMPLANT
SLEEVE ENDOPATH XCEL 5M (ENDOMECHANICALS) ×2 IMPLANT
SPONGE GAUZE 2X2 8PLY STRL LF (GAUZE/BANDAGES/DRESSINGS) ×8 IMPLANT
STAPLER VISISTAT (STAPLE) ×2 IMPLANT
SUT VICRYL 0 UR6 27IN ABS (SUTURE) ×2 IMPLANT
SYR 20CC LL (SYRINGE) ×2 IMPLANT
SYR 30ML LL (SYRINGE) ×2 IMPLANT
SYR CONTROL 10ML LL (SYRINGE) ×2 IMPLANT
TAPE CLOTH SURG 4X10 WHT LF (GAUZE/BANDAGES/DRESSINGS) ×1 IMPLANT
TROCAR ENDO BLADELESS 11MM (ENDOMECHANICALS) ×2 IMPLANT
TROCAR XCEL NON-BLD 5MMX100MML (ENDOMECHANICALS) ×2 IMPLANT
TROCAR XCEL UNIV SLVE 11M 100M (ENDOMECHANICALS) ×2 IMPLANT
TUBING INSUFFLATION (TUBING) ×2 IMPLANT
WARMER LAPAROSCOPE (MISCELLANEOUS) ×2 IMPLANT
YANKAUER SUCT 12FT TUBE ARGYLE (SUCTIONS) ×2 IMPLANT

## 2016-07-10 NOTE — Op Note (Signed)
Patient:  Margaret Flowers  DOB:  02-26-1956  MRN:  [id number]   Preop Diagnosis:  Acute cholecystitis, cholelithiasis  Postop Diagnosis:  Same, hydrops of gallbladder  Procedure:  Laparoscopic cholecystectomy  Surgeon:  Aviva Signs, M.D.  Assistant: Tama High, M.D.  Anes:  Gen. endotracheal  Indications:  Patient is a 60 year old white female who presents with right upper quadrant abdominal pain secondary to acute cholecystitis, cholelithiasis. The risks and benefits of the procedure including bleeding, infection, hepatobiliary injury, and the possibility of an open procedure were fully explained to the patient, who gave informed consent.  Procedure note:  The patient was placed the supine position. After induction of general endotracheal anesthesia, the abdomen was prepped and draped using the usual sterile technique with DuraPrep. Surgical site confirmation was performed.  A supraumbilical incision was made down to the fascia. A Veress needle was introduced into the abdominal cavity and confirmation of placement was done using the saline drop test. The abdomen was then insufflated to 16 mmHg pressure. An 11 mm trocar was introduced into the abdominal cavity under direct visualization without difficulty. The patient was placed in reverse Trendelenburg position and an additional 11 mm trocar was placed the epigastric region and 5 mm trochars were placed the right upper quadrant and right flank regions. Liver was inspected and noted within normal limits. The gallbladder was noted to be tense with a thickened, inflamed gallbladder wall. The gallbladder was decompressed at nor to facilitate exposure. Hydrops of the gallbladder was found. A stone was noted to be impacted in the neck of the gallbladder. The gallbladder was then retracted in a dynamic fashion in order to provide a critical view of the triangle of Calot. The cystic duct was first identified. Its junction to the infundibulum was  fully identified. As this area was significantly inflamed, I elected not to proceed with a cholangiogram. Endoclips were placed proximally and distally on the cystic duct, and the cystic duct was divided. This was likewise done cystic artery. The gallbladder was then freed away from the gallbladder fossa using Bovie electrocautery. The gallbladder was delivered through the epigastric trocar site using an Endo Catch bag. The gallbladder fossa was inspected and no abnormal bleeding or bile leakage was noted.  Arista and Surgicel placed in the gallbladder fossa. All fluid and air were then evacuated from the abdominal cavity prior to removal of the trochars.  All wounds were irrigated with normal saline. All wounds were injected with 0.5% Sensorcaine. The supraumbilical fascia as well as epigastric fascia reapproximated using 0 Vicryl interrupted sutures. All skin incisions were closed using staples. Betadine ointment and dry sterile dressings were applied.  All tape and needle counts were correct at the end of the procedure. Patient was extubated in the operating room and transferred to PACU in stable condition.  Complications:  None  EBL:  Minimal  Specimen:  Gallbladder

## 2016-07-10 NOTE — Progress Notes (Addendum)
Patient ID: Margaret Flowers, female   DOB: 06/01/1956, 60 y.o.   MRN: [id number]  PROGRESS NOTE    [facility]lind Ramp [facility]  [id number] DOB: 1955/12/11 DOA: 07/08/2016  PCP: [facility]   Brief Narrative:  60 year old female with past medical history significant for hypertension, dyslipidemia presented to hospital with worsening right upper quadrant abdominal pain, nausea and associated vomiting. Patient reported sharp, ongoing pain, 8 out of 10 at worst in intensity. No associated fevers or chills or jaundice. Patient has had multiple episodes of right upper quadrant abdominal pain over past 6 months prior to this admission which have spontaneously resolved. On admission, patient was hemodynamically stable. CT abdomen showed gallbladder distention with surrounding soft tissue stranding and small calcified gallstones in the gallbladder neck, findings suspicious for cystic duct obstruction and acute cholecystitis. Patient was seen by surgery in consultation and plan is for cholecystectomy today.   Assessment & Plan:    Active Problems: RUQ abdominal / Acute cholecystitis / Leukocytosis  - CT abdomen showed gallbladder distention with surrounding soft tissue stranding and small calcified gallstones in the gallbladder neck, findings suspicious for cystic duct obstruction and acute cholecystitis. - Plan for surgery oday - Continue IV fluids - Continue pain management efforts   Hypokalemia - Due to GI losses - Supplemented and WNL  Essential hypertension - Continue Norvasc and HCTZ  AKI - Likely prerenal - Improved with IV fluids    DVT prophylaxis: SCDs bilaterally Code Status: full code  Family Communication:  Family not at the bedside this am, update husband at bedside 12/28 Disposition Plan: Surgery today   Consultants:   Surgery   Procedures:   Cholecystectomy today  Antimicrobials:   None     Subjective: No overnight  events.  Objective: Vitals:   07/10/16 0930 07/10/16 1045 07/10/16 1100 07/10/16 1115  BP: 125/76 132/70 127/64 128/69  Pulse:  (!) 101 98 93  Resp: (!) 21 16 15 18   Temp:  98.5 F (36.9 C)    TempSrc:      SpO2: 94% 99% 96% 99%  Weight:      Height:        Intake/Output Summary (Last 24 hours) at 07/10/16 1126 Last data filed at 07/10/16 1047  Gross per 24 hour  Intake          2466.25 ml  Output             1350 ml  Net          1116.25 ml   Filed Weights   07/08/16 2124 07/09/16 0400 07/10/16 0426  Weight: 66.4 kg (146 lb 6.4 oz) 65.8 kg (145 lb 1.6 oz) 65.9 kg (145 lb 4.8 oz)    Examination:  General exam: Appears calm and comfortable,No acute distress  Respiratory system: No wheezing or rhonchi  Cardiovascular system: S1 & S2 heard, rate controlled  Gastrointestinal system: Appreciate bowel sounds, tender in upper abdomen Central nervous system: No focal neurological deficits. Extremities: Symmetric 5 x 5 power, no edema  Skin: Skin is warm and dry  Psychiatry: Mood & affect appropriate.   Data Reviewed: I have personally reviewed following labs and imaging studies  CBC:  Recent Labs Lab 07/08/16 1655 07/09/16 0613 07/10/16 0554  WBC 10.7* 10.1 13.8*  NEUTROABS 7.8*  --   --   HGB 14.0 13.4 13.7  HCT 41.3 39.1 41.7  MCV 82.9 83.7 83.6  PLT 257 219 [id number]   Basic Metabolic Panel:  Recent Labs Lab  07/08/16 1655 07/08/16 1817 07/09/16 0613 07/10/16 0554  NA 135  --  135 132*  K 2.8*  --  4.0 4.0  CL 97*  --  100* 96*  CO2 27  --  28 27  GLUCOSE 122*  --  134* 146*  BUN 20  --  10 7  CREATININE 1.04*  --  0.78 0.82  CALCIUM 9.4  --  8.8* 9.3  MG  --  2.0  --   --    GFR: Estimated Creatinine Clearance: 60.3 mL/min (by C-G formula based on SCr of 0.82 mg/dL). Liver Function Tests:  Recent Labs Lab 07/08/16 1655 07/09/16 0613 07/10/16 0554  AST 22 172* 94*  ALT 23 124* 110*  ALKPHOS 60 89 91  BILITOT 0.9 1.3* 1.5*  PROT 8.3* 7.2 8.1   ALBUMIN 4.1 3.5 3.7    Recent Labs Lab 07/08/16 1655  LIPASE 44   No results for input(s): AMMONIA in the last 168 hours. Coagulation Profile: No results for input(s): INR, PROTIME in the last 168 hours. Cardiac Enzymes:  Recent Labs Lab 07/08/16 1649  TROPONINI <0.03   BNP (last 3 results) No results for input(s): PROBNP in the last 8760 hours. HbA1C:  Recent Labs  07/08/16 1649  HGBA1C 5.8*   CBG:  Recent Labs Lab 07/09/16 2107 07/10/16 0006 07/10/16 0428 07/10/16 0751 07/10/16 1052  GLUCAP 148* 161* 148* 118* 176*   Lipid Profile: No results for input(s): CHOL, HDL, LDLCALC, TRIG, CHOLHDL, LDLDIRECT in the last 72 hours. Thyroid Function Tests: No results for input(s): TSH, T4TOTAL, FREET4, T3FREE, THYROIDAB in the last 72 hours. Anemia Panel: No results for input(s): VITAMINB12, FOLATE, FERRITIN, TIBC, IRON, RETICCTPCT in the last 72 hours. Urine analysis: No results found for: COLORURINE, APPEARANCEUR, LABSPEC, PHURINE, GLUCOSEU, HGBUR, BILIRUBINUR, KETONESUR, PROTEINUR, UROBILINOGEN, NITRITE, LEUKOCYTESUR Sepsis Labs: @LABRCNTIP (procalcitonin:4,lacticidven:4)   ) Recent Results (from the past 240 hour(s))  Surgical pcr screen     Status: None   Collection Time: 07/09/16  6:10 PM  Result Value Ref Range Status   MRSA, PCR NEGATIVE NEGATIVE Final   Staphylococcus aureus NEGATIVE NEGATIVE Final    Comment:        The Xpert SA Assay (FDA approved for NASAL specimens in patients over 44 years of age), is one component of a comprehensive surveillance program.  Test performance has been validated by [facility] for patients greater than or equal to 3 year old. It is not intended to diagnose infection nor to guide or monitor treatment.       Radiology Studies: Dg Chest 2 View Result Date: 07/08/2016 No active cardiopulmonary disease.    Ct Abdomen Pelvis W Contrast Result Date: 12/27/20171. Gallbladder distention with surrounding soft  tissue stranding and small calcified gallstones at the gallbladder neck. Findings are suspicious for cystic duct obstruction and acute cholecystitis. Correlate clinically. Ultrasound may be contributory, although nuclear medicine hepatobiliary scan would be the most definitive study. 2. No other acute or significant findings. The appendix appears normal.   US Abdomen Limited Ruq Result Date: 07/09/2016 Apparent gallstone adherent in the neck of the gallbladder. Mild sludge in gallbladder. No gallbladder wall thickening. Gallbladder does appear mildly distended, however. Relative increase in liver echogenicity most likely indicates a degree of hepatic steatosis. While no focal liver lesions are evident, it must be cautioned that the sensitivity of ultrasound for detection of focal liver lesions is diminished in this circumstance.    Scheduled Meds: . amLODipine  10 mg Oral QPM  .  aspirin EC  81 mg Oral Daily  . hydrochlorothiazide  12.5 mg Oral Daily  . insulin aspart  0-9 Units Subcutaneous TID WC  . pantoprazole  40 mg Oral Daily   Continuous Infusions: . dextrose 5 % and 0.45 % NaCl with KCl 20 mEq/L 75 mL/hr at 07/09/16 2302     LOS: 0 days    Time spent: 15 minutes  Greater than 50% of the time spent on counseling and coordinating the care.   Leisa Lenz, MD [facility] Pager [phone]  If 7PM-7AM, please contact night-coverage [email or web address] Password [facility] 07/10/2016, 11:26 AM

## 2016-07-10 NOTE — Anesthesia Procedure Notes (Signed)
Procedure Name: Intubation Date/Time: 07/10/2016 9:49 AM Performed by: Tressie Stalker E Pre-anesthesia Checklist: Patient identified, Patient being monitored, Timeout performed, Emergency Drugs available and Suction available Patient Re-evaluated:Patient Re-evaluated prior to inductionOxygen Delivery Method: Circle system utilized Preoxygenation: Pre-oxygenation with 100% oxygen Intubation Type: IV induction Ventilation: Mask ventilation without difficulty Laryngoscope Size: Mac and 3 Grade View: Grade I Tube type: Oral Tube size: 7.0 mm Number of attempts: 1 Airway Equipment and Method: Stylet Placement Confirmation: ETT inserted through vocal cords under direct vision,  positive ETCO2 and breath sounds checked- equal and bilateral Secured at: 21 cm Tube secured with: Tape Dental Injury: Teeth and Oropharynx as per pre-operative assessment

## 2016-07-10 NOTE — Anesthesia Preprocedure Evaluation (Signed)
Anesthesia Evaluation  Patient identified by MRN, date of birth, ID band Patient awake    Reviewed: Allergy & Precautions, NPO status , Patient's Chart, lab work & pertinent test results  Airway Mallampati: II  TM Distance: >3 FB Neck ROM: Full    Dental  (+) Teeth Intact   Pulmonary neg pulmonary ROS,    breath sounds clear to auscultation       Cardiovascular hypertension, Pt. on medications  Rhythm:Regular Rate:Normal     Neuro/Psych    GI/Hepatic GERD  ,  Endo/Other  diabetes, Type 2, Oral Hypoglycemic Agents  Renal/GU      Musculoskeletal   Abdominal   Peds  Hematology   Anesthesia Other Findings RUQ pain and nausea now.  Reproductive/Obstetrics                             Anesthesia Physical Anesthesia Plan  ASA: III  Anesthesia Plan: General   Post-op Pain Management:    Induction: Intravenous, Rapid sequence and Cricoid pressure planned  Airway Management Planned: Oral ETT  Additional Equipment:   Intra-op Plan:   Post-operative Plan: Extubation in OR  Informed Consent: I have reviewed the patients History and Physical, chart, labs and discussed the procedure including the risks, benefits and alternatives for the proposed anesthesia with the patient or authorized representative who has indicated his/her understanding and acceptance.     Plan Discussed with:   Anesthesia Plan Comments:         Anesthesia Quick Evaluation

## 2016-07-10 NOTE — Transfer of Care (Signed)
Immediate Anesthesia Transfer of Care Note  Patient: Margaret Flowers  Procedure(s) Performed: Procedure(s): LAPAROSCOPIC CHOLECYSTECTOMY WITH INTRAOPERATIVE CHOLANGIOGRAM (N/A)  Patient Location: PACU  Anesthesia Type:General  Level of Consciousness: awake  Airway & Oxygen Therapy: Patient Spontanous Breathing and Patient connected to face mask oxygen  Post-op Assessment: Report given to RN  Post vital signs: Reviewed and stable  Last Vitals:  Vitals:   07/10/16 0930 07/10/16 1045  BP: 125/76 (P) 132/70  Pulse:    Resp: (!) 21   Temp:  (P) 36.9 C    Last Pain:  Vitals:   07/10/16 0818  TempSrc: Oral  PainSc: 9       Patients Stated Pain Goal: 10 ([id number])  Complications: No apparent anesthesia complications

## 2016-07-10 NOTE — Addendum Note (Signed)
Addendum  created 07/10/16 1420 by Ollen Bowl, CRNA   Charge Capture section accepted

## 2016-07-10 NOTE — Progress Notes (Signed)
Patient requested not to be bothered anymore tonight. She does not want [place] to take her vital signs or cbg's again until AM. I explained that since she was post op that we needed to check her vital signs. Patient still requesting not to be awakened during the night.

## 2016-07-10 NOTE — Anesthesia Postprocedure Evaluation (Signed)
Anesthesia Post Note  Patient: Margaret Flowers  Procedure(s) Performed: Procedure(s) (LRB): LAPAROSCOPIC CHOLECYSTECTOMY (N/A)  Patient location during evaluation: Nursing Unit Anesthesia Type: General Level of consciousness: awake and alert and oriented Pain management: pain level controlled Vital Signs Assessment: post-procedure vital signs reviewed and stable Respiratory status: spontaneous breathing Cardiovascular status: blood pressure returned to baseline Postop Assessment: no signs of nausea or vomiting Anesthetic complications: no     Last Vitals:  Vitals:   07/10/16 1115 07/10/16 1130  BP: 128/69   Pulse: 93   Resp: 18   Temp:  36.9 C    Last Pain:  Vitals:   07/10/16 1130  TempSrc:   PainSc: 3                  Renn Stille

## 2016-07-11 LAB — CBC
HCT: 33.7 % — ABNORMAL LOW (ref 36.0–46.0)
HEMOGLOBIN: 11.3 g/dL — AB (ref 12.0–15.0)
MCH: 28.5 pg (ref 26.0–34.0)
MCHC: 33.5 g/dL (ref 30.0–36.0)
MCV: 85.1 fL (ref 78.0–100.0)
Platelets: 217 10*3/uL (ref 150–400)
RBC: 3.96 MIL/uL (ref 3.87–5.11)
RDW: 14.6 % (ref 11.5–15.5)
WBC: 10 10*3/uL (ref 4.0–10.5)

## 2016-07-11 LAB — HEPATIC FUNCTION PANEL
ALK PHOS: 62 U/L (ref 38–126)
ALT: 73 U/L — AB (ref 14–54)
AST: 56 U/L — ABNORMAL HIGH (ref 15–41)
Albumin: 2.9 g/dL — ABNORMAL LOW (ref 3.5–5.0)
BILIRUBIN DIRECT: 0.2 mg/dL (ref 0.1–0.5)
BILIRUBIN INDIRECT: 0.9 mg/dL (ref 0.3–0.9)
Total Bilirubin: 1.1 mg/dL (ref 0.3–1.2)
Total Protein: 6.7 g/dL (ref 6.5–8.1)

## 2016-07-11 LAB — GLUCOSE, CAPILLARY: Glucose-Capillary: 103 mg/dL — ABNORMAL HIGH (ref 65–99)

## 2016-07-11 LAB — BASIC METABOLIC PANEL
ANION GAP: 7 (ref 5–15)
BUN: 9 mg/dL (ref 6–20)
CHLORIDE: 100 mmol/L — AB (ref 101–111)
CO2: 27 mmol/L (ref 22–32)
CREATININE: 0.78 mg/dL (ref 0.44–1.00)
Calcium: 8.7 mg/dL — ABNORMAL LOW (ref 8.9–10.3)
GFR calc Af Amer: >60 mL/min (ref >60)
GFR calc non Af Amer: >60 mL/min (ref >60)
GLUCOSE: 104 mg/dL — AB (ref 65–99)
Potassium: 4 mmol/L (ref 3.5–5.1)
Sodium: 134 mmol/L — ABNORMAL LOW (ref 135–145)

## 2016-07-11 MED ORDER — TRAMADOL HCL 50 MG PO TABS: 50.0000 mg | ORAL_TABLET | Freq: Four times a day (QID) | ORAL | 0 refills | Status: DC | PRN

## 2016-07-11 NOTE — Discharge Instructions (Signed)
Laparoscopic Cholecystectomy, Care After °This sheet gives you information about how to care for yourself after your procedure. Your health care provider may also give you more specific instructions. If you have problems or questions, contact your health care provider. °What can I expect after the procedure? °After the procedure, it is common to have: °· Pain at your incision sites. You will be given medicines to control this pain. °· Mild nausea or vomiting. °· Bloating and possible shoulder pain from the air-like gas that was used during the procedure. °Follow these instructions at home: °Incision care  ° °· Follow instructions from your health care provider about how to take care of your incisions. Make sure you: °¨ Wash your hands with soap and water before you change your bandage (dressing). If soap and water are not available, use hand sanitizer. °¨ Change your dressing as told by your health care provider. °¨ Leave stitches (sutures), skin glue, or adhesive strips in place. These skin closures may need to be in place for 2 weeks or longer. If adhesive strip edges start to loosen and curl up, you may trim the loose edges. Do not remove adhesive strips completely unless your health care provider tells you to do that. °· Do not take baths, swim, or use a hot tub until your health care provider approves. Ask your health care provider if you can take showers. You may only be allowed to take sponge baths for bathing. °· Check your incision area every day for signs of infection. Check for: °¨ More redness, swelling, or pain. °¨ More fluid or blood. °¨ Warmth. °¨ Pus or a bad smell. °Activity  °· Do not drive or use heavy machinery while taking prescription pain medicine. °· Do not lift anything that is heavier than 10 lb (4.5 kg) until your health care provider approves. °· Do not play contact sports until your health care provider approves. °· Do not drive for 24 hours if you were given a medicine to help you relax  (sedative). °· Rest as needed. Do not return to work or school until your health care provider approves. °General instructions  °· Take over-the-counter and prescription medicines only as told by your health care provider. °· To prevent or treat constipation while you are taking prescription pain medicine, your health care provider may recommend that you: °¨ Drink enough fluid to keep your urine clear or pale yellow. °¨ Take over-the-counter or prescription medicines. °¨ Eat foods that are high in fiber, such as fresh fruits and vegetables, whole grains, and beans. °¨ Limit foods that are high in fat and processed sugars, such as fried and sweet foods. °Contact a health care provider if: °· You develop a rash. °· You have more redness, swelling, or pain around your incisions. °· You have more fluid or blood coming from your incisions. °· Your incisions feel warm to the touch. °· You have pus or a bad smell coming from your incisions. °· You have a fever. °· One or more of your incisions breaks open. °Get help right away if: °· You have trouble breathing. °· You have chest pain. °· You have increasing pain in your shoulders. °· You faint or feel dizzy when you stand. °· You have severe pain in your abdomen. °· You have nausea or vomiting that lasts for more than one day. °· You have leg pain. °This information is not intended to replace advice given to you by your health care provider. Make sure you discuss any   questions you have with your health care provider. °Document Released: 06/29/2005 Document Revised: 01/18/2016 Document Reviewed: 12/16/2015 °Elsevier Interactive Patient Education © 2017 [employer] Inc. ° °

## 2016-07-11 NOTE — Progress Notes (Signed)
Pt discharged home today per Dr. Jenkins. Pt's IV site D/C'd and WDL. Pt's VSS. Pt provided with home medication list, discharge instructions and prescriptions. Verbalized understanding. Pt left floor via WC in stable condition accompanied by NT. 

## 2016-07-11 NOTE — Discharge Summary (Signed)
Physician Discharge Summary  Patient ID: Margaret Flowers MRN: [id number] DOB/AGE: 62-Sep-1957 62 y.o.  Admit date: 07/08/2016 Discharge date: 07/11/2016  Admission Diagnoses:Acute cholecystitis, cholelithiasis  Discharge Diagnoses: Same Active Problems:   Diabetes ([place])   Abdominal pain   Hypokalemia   Cholecystitis   Cholecystitis, acute   Discharged Condition: good  Hospital Course: Patient is a 60 year old white female who presented emergency room with worsening right upper quadrant abdominal pain, nausea, and vomiting. She was found on CT scan the abdomen to have acute cholecystitis. She was admitted to the hospital for further evaluation treatment. Ultrasound the gallbladder did reveal cholelithiasis. Her common bile duct is within normal limits. She did have elevations of her liver enzyme tests. She subsequently went to the operating room on 07/10/2016 and underwent laparoscopic cholecystectomy. She tolerated the procedure well. Her postoperative course has been unremarkable. Her diet was advanced without difficulty. Her leukocytosis has resolved. Her liver enzyme tests are normalizing. She is being discharged home in good and improving condition.  Treatments: surgery: Laparoscopic cholecystectomy on 07/10/2016  Discharge Exam: Blood pressure (!) 119/55, pulse (!) 105, temperature 99.4 F (37.4 C), temperature source Oral, resp. rate 20, height 5' (1.524 m), weight 65.9 kg (145 lb 4.8 oz), SpO2 90 %. General appearance: alert, cooperative and no distress Resp: clear to auscultation bilaterally Cardio: regular rate and rhythm, S1, S2 normal, no murmur, click, rub or gallop GI: Soft. Dressings dry and intact.  Disposition: 01-Home or Self Care   Allergies as of 07/11/2016      Reactions   Benadryl [diphenhydramine Hcl] Other (See Comments)   Reaction:  Hyperactivity    Codeine Other (See Comments)   Reaction:  Hyperactivity    Amoxicillin Rash, Other (See Comments)   Has patient had a PCN reaction causing immediate rash, facial/tongue/throat swelling, SOB or lightheadedness with hypotension: No Has patient had a PCN reaction causing severe rash involving mucus membranes or skin necrosis: No Has patient had a PCN reaction that required hospitalization: No Has patient had a PCN reaction occurring within the last 10 years: No If all of the above answers are "NO", then may proceed with Cephalosporin use.   Sulfa Antibiotics Rash      Medication List    TAKE these medications   amLODipine 10 MG tablet Commonly known as:  NORVASC Take 10 mg by mouth every evening.   aspirin EC 81 MG tablet Take 81 mg by mouth daily.   Fish Oil 1200 MG Caps Take 1,200 mg by mouth daily.   hydrochlorothiazide 12.5 MG capsule Commonly known as:  MICROZIDE Take 12.5 mg by mouth daily.   NUTRITIONAL SUPPLEMENT PLUS Liqd Take 1 Bottle by mouth daily.   omeprazole 20 MG capsule Commonly known as:  PRILOSEC Take 1 capsule (20 mg total) by mouth daily.   traMADol 50 MG tablet Commonly known as:  ULTRAM Take 1 tablet (50 mg total) by mouth every 6 (six) hours as needed for moderate pain.   Vitamin D3 5000 units Caps Take 5,000 Units by mouth daily.      Follow-up Information    Jamesetta So, MD. Schedule an appointment as soon as possible for a visit on 07/21/2016.   Specialty:  General Surgery Contact information: [address] [id number] [phone]           Signed: Aviva Signs A 07/11/2016, 9:50 AM

## 2016-07-14 ENCOUNTER — Encounter (HOSPITAL_COMMUNITY): Payer: Self-pay | Admitting: General Surgery

## 2016-11-20 ENCOUNTER — Other Ambulatory Visit (HOSPITAL_COMMUNITY): Payer: Self-pay | Admitting: Family Medicine

## 2016-11-20 DIAGNOSIS — Z1231 Encounter for screening mammogram for malignant neoplasm of breast: Secondary | ICD-10-CM

## 2017-01-14 ENCOUNTER — Other Ambulatory Visit: Payer: Self-pay | Admitting: Adult Health

## 2017-01-29 ENCOUNTER — Other Ambulatory Visit (HOSPITAL_COMMUNITY): Payer: Self-pay | Admitting: Family Medicine

## 2017-01-29 DIAGNOSIS — Z1231 Encounter for screening mammogram for malignant neoplasm of breast: Secondary | ICD-10-CM

## 2017-02-19 ENCOUNTER — Ambulatory Visit (HOSPITAL_COMMUNITY)
Admission: RE | Admit: 2017-02-19 | Discharge: 2017-02-19 | Disposition: A | Discharge status: Home / Self Care | Payer: BLUE CROSS/BLUE SHIELD | Source: Ambulatory Visit | Attending: Family Medicine | Admitting: Family Medicine

## 2017-02-19 DIAGNOSIS — Z1231 Encounter for screening mammogram for malignant neoplasm of breast: Secondary | ICD-10-CM | POA: Diagnosis not present

## 2017-03-18 ENCOUNTER — Other Ambulatory Visit: Payer: Self-pay | Admitting: Adult Health

## 2017-05-03 DIAGNOSIS — Z23 Encounter for immunization: Secondary | ICD-10-CM | POA: Diagnosis not present

## 2017-07-29 DIAGNOSIS — I10 Essential (primary) hypertension: Secondary | ICD-10-CM | POA: Diagnosis not present

## 2017-07-29 DIAGNOSIS — Z6826 Body mass index (BMI) 26.0-26.9, adult: Secondary | ICD-10-CM | POA: Diagnosis not present

## 2017-07-29 DIAGNOSIS — Z0001 Encounter for general adult medical examination with abnormal findings: Secondary | ICD-10-CM | POA: Diagnosis not present

## 2017-07-29 DIAGNOSIS — E782 Mixed hyperlipidemia: Secondary | ICD-10-CM | POA: Diagnosis not present

## 2017-07-29 DIAGNOSIS — E663 Overweight: Secondary | ICD-10-CM | POA: Diagnosis not present

## 2017-07-29 DIAGNOSIS — L821 Other seborrheic keratosis: Secondary | ICD-10-CM | POA: Diagnosis not present

## 2017-07-29 DIAGNOSIS — Z1389 Encounter for screening for other disorder: Secondary | ICD-10-CM | POA: Diagnosis not present

## 2018-03-14 ENCOUNTER — Other Ambulatory Visit: Payer: Self-pay | Admitting: Adult Health

## 2018-04-07 ENCOUNTER — Other Ambulatory Visit: Payer: Self-pay | Admitting: Adult Health

## 2018-04-07 DIAGNOSIS — Z1231 Encounter for screening mammogram for malignant neoplasm of breast: Secondary | ICD-10-CM

## 2018-04-08 ENCOUNTER — Other Ambulatory Visit: Payer: Self-pay | Admitting: Adult Health

## 2018-05-04 ENCOUNTER — Ambulatory Visit (HOSPITAL_COMMUNITY)
Admission: RE | Admit: 2018-05-04 | Discharge: 2018-05-04 | Disposition: A | Discharge status: Home / Self Care | Payer: BLUE CROSS/BLUE SHIELD | Source: Ambulatory Visit | Attending: Adult Health | Admitting: Adult Health

## 2018-05-04 DIAGNOSIS — Z1231 Encounter for screening mammogram for malignant neoplasm of breast: Secondary | ICD-10-CM | POA: Diagnosis not present

## 2018-05-11 DIAGNOSIS — Z23 Encounter for immunization: Secondary | ICD-10-CM | POA: Diagnosis not present

## 2018-05-17 ENCOUNTER — Ambulatory Visit: Payer: BLUE CROSS/BLUE SHIELD | Admitting: Adult Health

## 2018-05-17 ENCOUNTER — Encounter: Payer: Self-pay | Admitting: Adult Health

## 2018-05-17 VITALS — BP 140/79 | HR 89 | Ht 60.0 in | Wt 155.5 lb

## 2018-05-17 DIAGNOSIS — Z1212 Encounter for screening for malignant neoplasm of rectum: Secondary | ICD-10-CM

## 2018-05-17 DIAGNOSIS — Z01419 Encounter for gynecological examination (general) (routine) without abnormal findings: Secondary | ICD-10-CM

## 2018-05-17 DIAGNOSIS — Z1211 Encounter for screening for malignant neoplasm of colon: Secondary | ICD-10-CM | POA: Diagnosis not present

## 2018-05-17 LAB — HEMOCCULT GUIAC POC 1CARD (OFFICE): Fecal Occult Blood, POC: NEGATIVE

## 2018-05-17 NOTE — Progress Notes (Signed)
Patient ID: Margaret Flowers, female   DOB: 11/26/1955, 62 y.o.   MRN: [id number] History of Present Illness:  Margaret Flowers is a 62 year old white female, married in for a well woman gyn exam, she had a normal pap with negative HPV 01/03/16. She has changed jobs and is behind a computer more.And has had kitchen remodeled and has eaten out more and has gained a little weight.She had flu shot last week and mammogram about 2 weeks ago.  Husband has retired and is not doing as well, legs bother him, has diabetes.  PCP is Margaret Flowers.  Current Medications, Allergies, Past Medical History, Past Surgical History, Family History and Social History were reviewed in [employer] record.     Review of Systems:  Patient denies any headaches, hearing loss, fatigue, blurred vision, shortness of breath, chest pain, abdominal pain, problems with bowel movements, urination, or intercourse(not active). No joint pain or mood swings.   Physical Exam:BP 140/79 (BP Location: Left Arm, Patient Position: Sitting, Cuff Size: Normal)   Pulse 89   Ht 5' (1.524 m)   Wt 155 lb 8 oz (70.5 kg)   BMI 30.37 kg/m  General:  Well developed, well nourished, no acute distress Skin:  Warm and dry Neck:  Midline trachea, normal thyroid, good ROM, no lymphadenopathy, no carotid bruits heard.  Lungs; Clear to auscultation bilaterally Breast:  No dominant palpable mass, retraction, or nipple discharge Cardiovascular: Regular rate and rhythm Abdomen:  Soft, non tender, no hepatosplenomegaly Pelvic:  External genitalia is normal in appearance, no lesions.  The vagina is normal in appearance for age with loss of color, moisture and rugae. Urethra has no lesions or masses. The cervix is smooth..  Uterus is felt to be normal size, shape, and contour.  No adnexal masses or tenderness noted.Bladder is non tender, no masses felt. Rectal: Good sphincter tone, no polyps, or hemorrhoids felt.  Hemoccult  negative. Extremities/musculoskeletal:  No swelling or varicosities noted, no clubbing or cyanosis Psych:  No mood changes, alert and cooperative,seems happy PHQ 2 score 0. Examination chaperoned by Levy Pupa LPN.  Impression: 1. Encounter for well woman exam with routine gynecological exam   2. Screening for colorectal cancer       Plan: Pap and physical in 1 year Mammogram yearly Labs with PCP Colonoscopy per GI

## 2018-06-16 ENCOUNTER — Other Ambulatory Visit: Payer: Self-pay | Admitting: *Deleted

## 2018-06-16 MED ORDER — HYDROCHLOROTHIAZIDE 12.5 MG PO CAPS: 12.5000 mg | ORAL_CAPSULE | Freq: Every day | ORAL | 4 refills | Status: DC

## 2018-08-05 DIAGNOSIS — Z6827 Body mass index (BMI) 27.0-27.9, adult: Secondary | ICD-10-CM | POA: Diagnosis not present

## 2018-08-05 DIAGNOSIS — B029 Zoster without complications: Secondary | ICD-10-CM | POA: Diagnosis not present

## 2018-08-05 DIAGNOSIS — E663 Overweight: Secondary | ICD-10-CM | POA: Diagnosis not present

## 2018-08-05 DIAGNOSIS — Z1389 Encounter for screening for other disorder: Secondary | ICD-10-CM | POA: Diagnosis not present

## 2019-04-12 ENCOUNTER — Encounter: Payer: Self-pay | Admitting: Internal Medicine

## 2019-04-14 DIAGNOSIS — H524 Presbyopia: Secondary | ICD-10-CM | POA: Diagnosis not present

## 2019-04-14 DIAGNOSIS — D3131 Benign neoplasm of right choroid: Secondary | ICD-10-CM | POA: Diagnosis not present

## 2019-04-14 DIAGNOSIS — H04123 Dry eye syndrome of bilateral lacrimal glands: Secondary | ICD-10-CM | POA: Diagnosis not present

## 2019-04-14 DIAGNOSIS — H2513 Age-related nuclear cataract, bilateral: Secondary | ICD-10-CM | POA: Diagnosis not present

## 2019-06-01 DIAGNOSIS — E663 Overweight: Secondary | ICD-10-CM | POA: Diagnosis not present

## 2019-06-01 DIAGNOSIS — E7849 Other hyperlipidemia: Secondary | ICD-10-CM | POA: Diagnosis not present

## 2019-06-01 DIAGNOSIS — Z6827 Body mass index (BMI) 27.0-27.9, adult: Secondary | ICD-10-CM | POA: Diagnosis not present

## 2019-06-01 DIAGNOSIS — E119 Type 2 diabetes mellitus without complications: Secondary | ICD-10-CM | POA: Diagnosis not present

## 2019-06-01 DIAGNOSIS — I10 Essential (primary) hypertension: Secondary | ICD-10-CM | POA: Diagnosis not present

## 2019-06-01 DIAGNOSIS — Z0001 Encounter for general adult medical examination with abnormal findings: Secondary | ICD-10-CM | POA: Diagnosis not present

## 2019-06-01 DIAGNOSIS — Z1389 Encounter for screening for other disorder: Secondary | ICD-10-CM | POA: Diagnosis not present

## 2019-06-01 DIAGNOSIS — Z23 Encounter for immunization: Secondary | ICD-10-CM | POA: Diagnosis not present

## 2019-07-13 DIAGNOSIS — E119 Type 2 diabetes mellitus without complications: Secondary | ICD-10-CM | POA: Diagnosis not present

## 2019-07-13 DIAGNOSIS — E7849 Other hyperlipidemia: Secondary | ICD-10-CM | POA: Diagnosis not present

## 2019-08-13 DIAGNOSIS — E7849 Other hyperlipidemia: Secondary | ICD-10-CM | POA: Diagnosis not present

## 2019-08-13 DIAGNOSIS — E119 Type 2 diabetes mellitus without complications: Secondary | ICD-10-CM | POA: Diagnosis not present

## 2019-09-04 ENCOUNTER — Other Ambulatory Visit (HOSPITAL_COMMUNITY): Payer: Self-pay | Admitting: Adult Health

## 2019-09-04 DIAGNOSIS — Z1231 Encounter for screening mammogram for malignant neoplasm of breast: Secondary | ICD-10-CM

## 2019-09-18 ENCOUNTER — Other Ambulatory Visit: Payer: Self-pay

## 2019-09-18 ENCOUNTER — Ambulatory Visit (HOSPITAL_COMMUNITY)
Admission: RE | Admit: 2019-09-18 | Discharge: 2019-09-18 | Disposition: A | Discharge status: Home / Self Care | Payer: Medicare HMO | Source: Ambulatory Visit | Attending: Adult Health | Admitting: Adult Health

## 2019-09-18 DIAGNOSIS — Z1231 Encounter for screening mammogram for malignant neoplasm of breast: Secondary | ICD-10-CM | POA: Insufficient documentation

## 2019-11-10 DIAGNOSIS — E7849 Other hyperlipidemia: Secondary | ICD-10-CM | POA: Diagnosis not present

## 2019-11-10 DIAGNOSIS — E119 Type 2 diabetes mellitus without complications: Secondary | ICD-10-CM | POA: Diagnosis not present

## 2019-11-10 DIAGNOSIS — I10 Essential (primary) hypertension: Secondary | ICD-10-CM | POA: Diagnosis not present

## 2020-01-26 DIAGNOSIS — Z1389 Encounter for screening for other disorder: Secondary | ICD-10-CM | POA: Diagnosis not present

## 2020-01-26 DIAGNOSIS — Z6826 Body mass index (BMI) 26.0-26.9, adult: Secondary | ICD-10-CM | POA: Diagnosis not present

## 2020-01-26 DIAGNOSIS — Z0000 Encounter for general adult medical examination without abnormal findings: Secondary | ICD-10-CM | POA: Diagnosis not present

## 2020-01-26 DIAGNOSIS — E7849 Other hyperlipidemia: Secondary | ICD-10-CM | POA: Diagnosis not present

## 2020-01-26 DIAGNOSIS — E663 Overweight: Secondary | ICD-10-CM | POA: Diagnosis not present

## 2020-01-26 DIAGNOSIS — I10 Essential (primary) hypertension: Secondary | ICD-10-CM | POA: Diagnosis not present

## 2020-01-26 DIAGNOSIS — E119 Type 2 diabetes mellitus without complications: Secondary | ICD-10-CM | POA: Diagnosis not present

## 2020-02-09 DIAGNOSIS — E7849 Other hyperlipidemia: Secondary | ICD-10-CM | POA: Diagnosis not present

## 2020-02-09 DIAGNOSIS — I10 Essential (primary) hypertension: Secondary | ICD-10-CM | POA: Diagnosis not present

## 2020-02-09 DIAGNOSIS — E119 Type 2 diabetes mellitus without complications: Secondary | ICD-10-CM | POA: Diagnosis not present

## 2020-02-15 DIAGNOSIS — L820 Inflamed seborrheic keratosis: Secondary | ICD-10-CM | POA: Diagnosis not present

## 2020-03-12 DIAGNOSIS — E119 Type 2 diabetes mellitus without complications: Secondary | ICD-10-CM | POA: Diagnosis not present

## 2020-03-12 DIAGNOSIS — E7849 Other hyperlipidemia: Secondary | ICD-10-CM | POA: Diagnosis not present

## 2020-03-12 DIAGNOSIS — I10 Essential (primary) hypertension: Secondary | ICD-10-CM | POA: Diagnosis not present

## 2020-04-11 DIAGNOSIS — E7849 Other hyperlipidemia: Secondary | ICD-10-CM | POA: Diagnosis not present

## 2020-04-11 DIAGNOSIS — E119 Type 2 diabetes mellitus without complications: Secondary | ICD-10-CM | POA: Diagnosis not present

## 2020-05-11 DIAGNOSIS — E7849 Other hyperlipidemia: Secondary | ICD-10-CM | POA: Diagnosis not present

## 2020-05-11 DIAGNOSIS — E119 Type 2 diabetes mellitus without complications: Secondary | ICD-10-CM | POA: Diagnosis not present

## 2020-09-09 DIAGNOSIS — E119 Type 2 diabetes mellitus without complications: Secondary | ICD-10-CM | POA: Diagnosis not present

## 2020-09-09 DIAGNOSIS — E7849 Other hyperlipidemia: Secondary | ICD-10-CM | POA: Diagnosis not present

## 2020-10-09 DIAGNOSIS — E119 Type 2 diabetes mellitus without complications: Secondary | ICD-10-CM | POA: Diagnosis not present

## 2020-10-09 DIAGNOSIS — E7849 Other hyperlipidemia: Secondary | ICD-10-CM | POA: Diagnosis not present

## 2021-01-09 DIAGNOSIS — E119 Type 2 diabetes mellitus without complications: Secondary | ICD-10-CM | POA: Diagnosis not present

## 2021-01-09 DIAGNOSIS — E782 Mixed hyperlipidemia: Secondary | ICD-10-CM | POA: Diagnosis not present

## 2021-02-18 ENCOUNTER — Other Ambulatory Visit (HOSPITAL_COMMUNITY): Payer: Self-pay | Admitting: Family Medicine

## 2021-02-18 DIAGNOSIS — Z1231 Encounter for screening mammogram for malignant neoplasm of breast: Secondary | ICD-10-CM

## 2021-03-05 ENCOUNTER — Other Ambulatory Visit: Payer: Self-pay

## 2021-03-05 ENCOUNTER — Ambulatory Visit (HOSPITAL_COMMUNITY)
Admission: RE | Admit: 2021-03-05 | Discharge: 2021-03-05 | Disposition: A | Discharge status: Home / Self Care | Payer: Medicare HMO | Source: Ambulatory Visit | Attending: Family Medicine | Admitting: Family Medicine

## 2021-03-05 DIAGNOSIS — Z1231 Encounter for screening mammogram for malignant neoplasm of breast: Secondary | ICD-10-CM

## 2021-03-06 DIAGNOSIS — Z0001 Encounter for general adult medical examination with abnormal findings: Secondary | ICD-10-CM | POA: Diagnosis not present

## 2021-03-06 DIAGNOSIS — L409 Psoriasis, unspecified: Secondary | ICD-10-CM | POA: Diagnosis not present

## 2021-03-06 DIAGNOSIS — E663 Overweight: Secondary | ICD-10-CM | POA: Diagnosis not present

## 2021-03-06 DIAGNOSIS — E782 Mixed hyperlipidemia: Secondary | ICD-10-CM | POA: Diagnosis not present

## 2021-03-06 DIAGNOSIS — E119 Type 2 diabetes mellitus without complications: Secondary | ICD-10-CM | POA: Diagnosis not present

## 2021-03-06 DIAGNOSIS — Z6826 Body mass index (BMI) 26.0-26.9, adult: Secondary | ICD-10-CM | POA: Diagnosis not present

## 2021-03-06 DIAGNOSIS — Z1389 Encounter for screening for other disorder: Secondary | ICD-10-CM | POA: Diagnosis not present

## 2021-03-06 DIAGNOSIS — Z1331 Encounter for screening for depression: Secondary | ICD-10-CM | POA: Diagnosis not present

## 2021-03-06 DIAGNOSIS — G4709 Other insomnia: Secondary | ICD-10-CM | POA: Diagnosis not present

## 2021-03-06 DIAGNOSIS — I10 Essential (primary) hypertension: Secondary | ICD-10-CM | POA: Diagnosis not present

## 2021-03-18 ENCOUNTER — Other Ambulatory Visit (HOSPITAL_COMMUNITY): Payer: Self-pay | Admitting: Family Medicine

## 2021-03-18 DIAGNOSIS — E2839 Other primary ovarian failure: Secondary | ICD-10-CM

## 2021-03-26 ENCOUNTER — Other Ambulatory Visit: Payer: Self-pay

## 2021-03-26 ENCOUNTER — Ambulatory Visit (HOSPITAL_COMMUNITY)
Admission: RE | Admit: 2021-03-26 | Discharge: 2021-03-26 | Disposition: A | Discharge status: Home / Self Care | Payer: Medicare HMO | Source: Ambulatory Visit | Attending: Family Medicine | Admitting: Family Medicine

## 2021-03-26 DIAGNOSIS — E2839 Other primary ovarian failure: Secondary | ICD-10-CM | POA: Insufficient documentation

## 2021-03-26 DIAGNOSIS — Z780 Asymptomatic menopausal state: Secondary | ICD-10-CM | POA: Diagnosis not present

## 2021-03-26 DIAGNOSIS — M8589 Other specified disorders of bone density and structure, multiple sites: Secondary | ICD-10-CM | POA: Diagnosis not present

## 2021-04-11 DIAGNOSIS — E782 Mixed hyperlipidemia: Secondary | ICD-10-CM | POA: Diagnosis not present

## 2021-04-11 DIAGNOSIS — E119 Type 2 diabetes mellitus without complications: Secondary | ICD-10-CM | POA: Diagnosis not present

## 2021-04-25 DIAGNOSIS — Z23 Encounter for immunization: Secondary | ICD-10-CM | POA: Diagnosis not present

## 2021-07-11 DIAGNOSIS — E119 Type 2 diabetes mellitus without complications: Secondary | ICD-10-CM | POA: Diagnosis not present

## 2021-07-11 DIAGNOSIS — E782 Mixed hyperlipidemia: Secondary | ICD-10-CM | POA: Diagnosis not present

## 2021-10-10 DIAGNOSIS — E782 Mixed hyperlipidemia: Secondary | ICD-10-CM | POA: Diagnosis not present

## 2021-10-10 DIAGNOSIS — E119 Type 2 diabetes mellitus without complications: Secondary | ICD-10-CM | POA: Diagnosis not present

## 2022-01-15 DIAGNOSIS — H5200 Hypermetropia, unspecified eye: Secondary | ICD-10-CM | POA: Diagnosis not present

## 2022-01-22 DIAGNOSIS — I10 Essential (primary) hypertension: Secondary | ICD-10-CM | POA: Diagnosis not present

## 2022-01-22 DIAGNOSIS — R5383 Other fatigue: Secondary | ICD-10-CM | POA: Diagnosis not present

## 2022-01-22 DIAGNOSIS — Z6826 Body mass index (BMI) 26.0-26.9, adult: Secondary | ICD-10-CM | POA: Diagnosis not present

## 2022-01-22 DIAGNOSIS — E782 Mixed hyperlipidemia: Secondary | ICD-10-CM | POA: Diagnosis not present

## 2022-01-22 DIAGNOSIS — E119 Type 2 diabetes mellitus without complications: Secondary | ICD-10-CM | POA: Diagnosis not present

## 2022-01-22 DIAGNOSIS — E663 Overweight: Secondary | ICD-10-CM | POA: Diagnosis not present

## 2022-02-02 ENCOUNTER — Other Ambulatory Visit (HOSPITAL_COMMUNITY): Payer: Self-pay | Admitting: Family Medicine

## 2022-02-02 DIAGNOSIS — Z1231 Encounter for screening mammogram for malignant neoplasm of breast: Secondary | ICD-10-CM

## 2022-02-10 IMAGING — MG DIGITAL SCREENING BILAT W/ TOMO W/ CAD
8 series · 8 of 24 positions shown · non-contrast
Comparison: Previous exam(s).

CLINICAL DATA: Screening.

EXAM:
DIGITAL SCREENING BILATERAL MAMMOGRAM WITH TOMO AND CAD

[R MLO synth-2D]
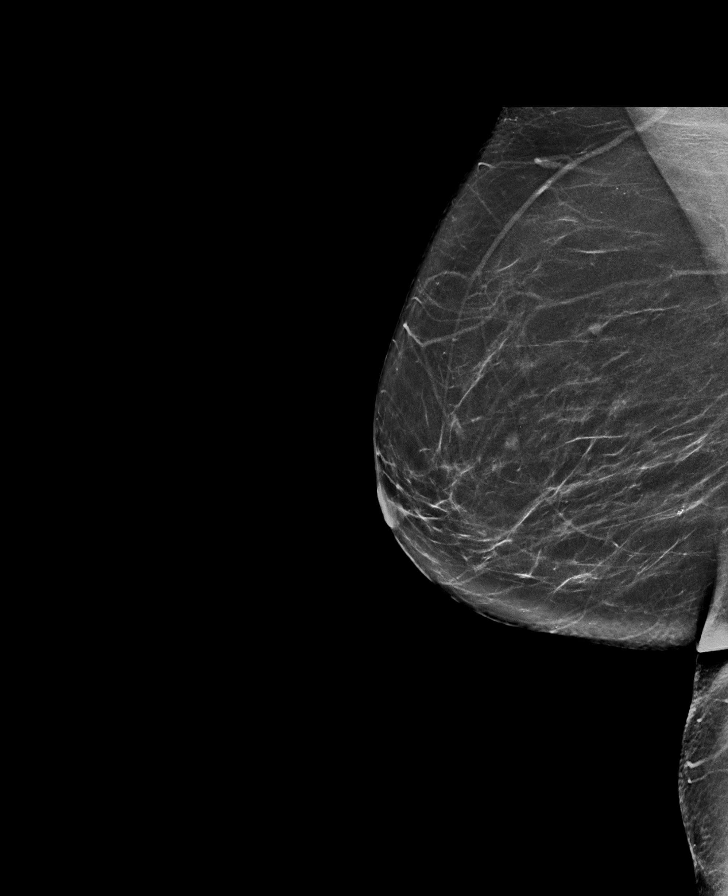

[L MLO synth-2D]
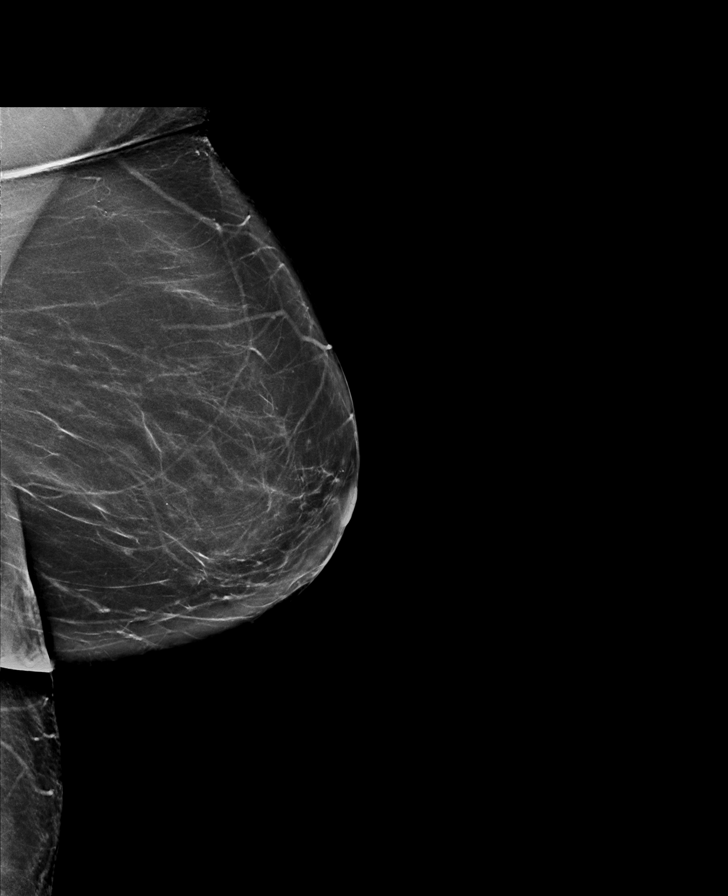

[R CC synth-2D]
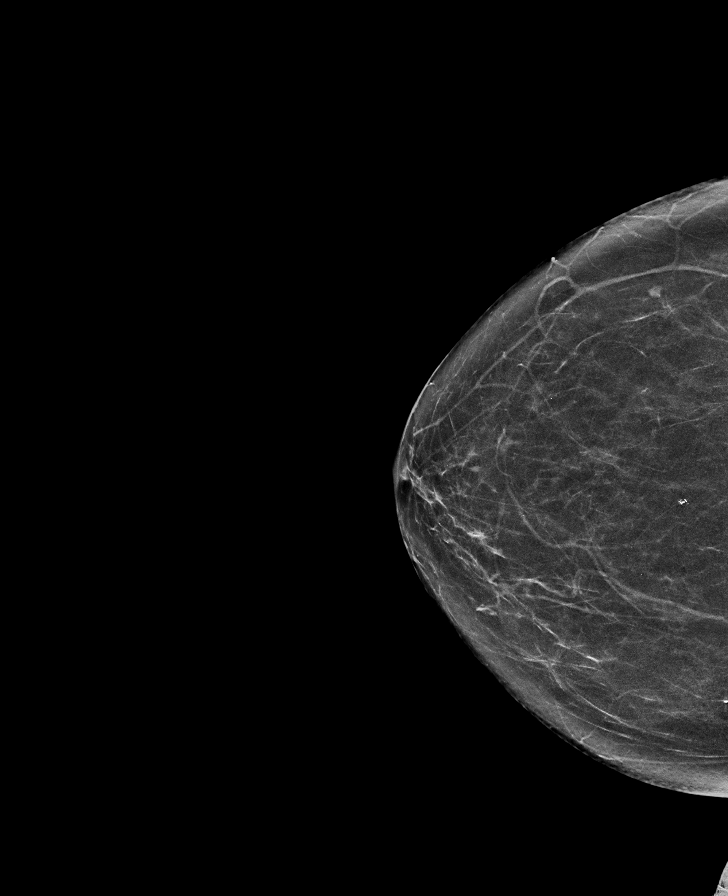

[L CC synth-2D]
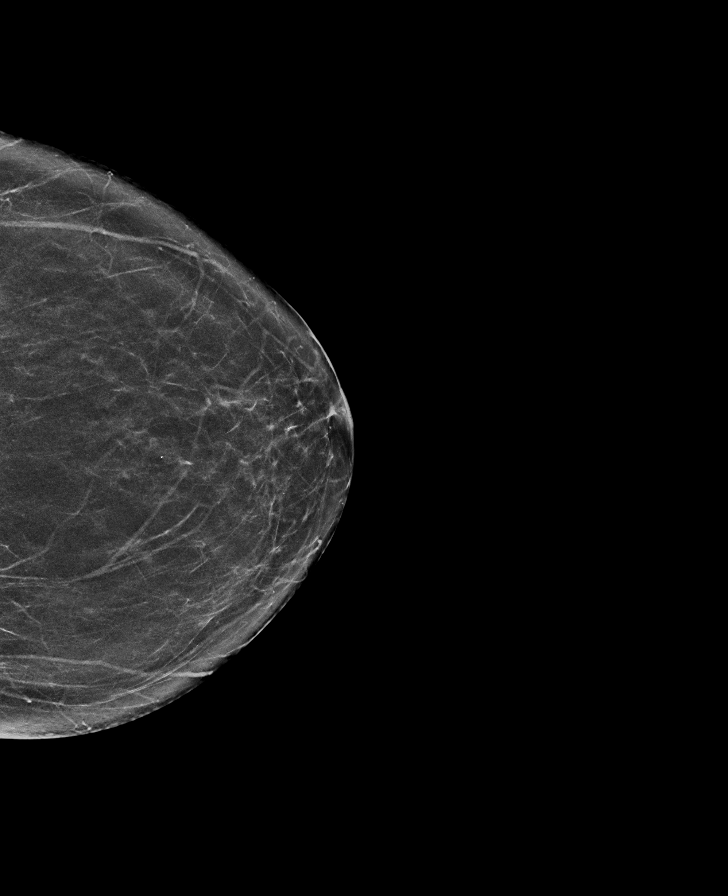

[R CC tomo · tomo slice 31/60.0]
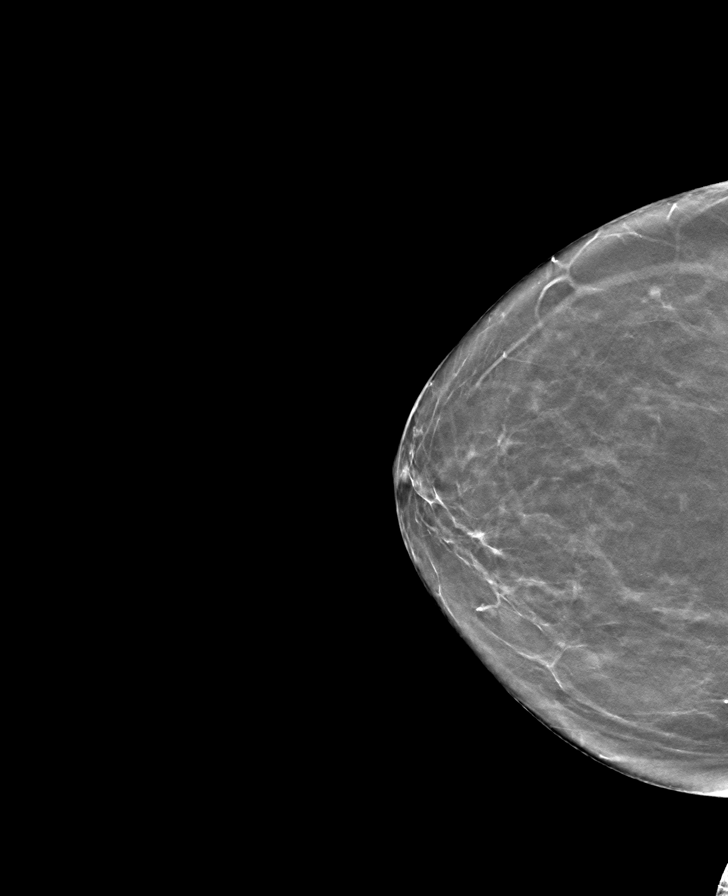

[L MLO tomo · tomo slice 37/72.0]
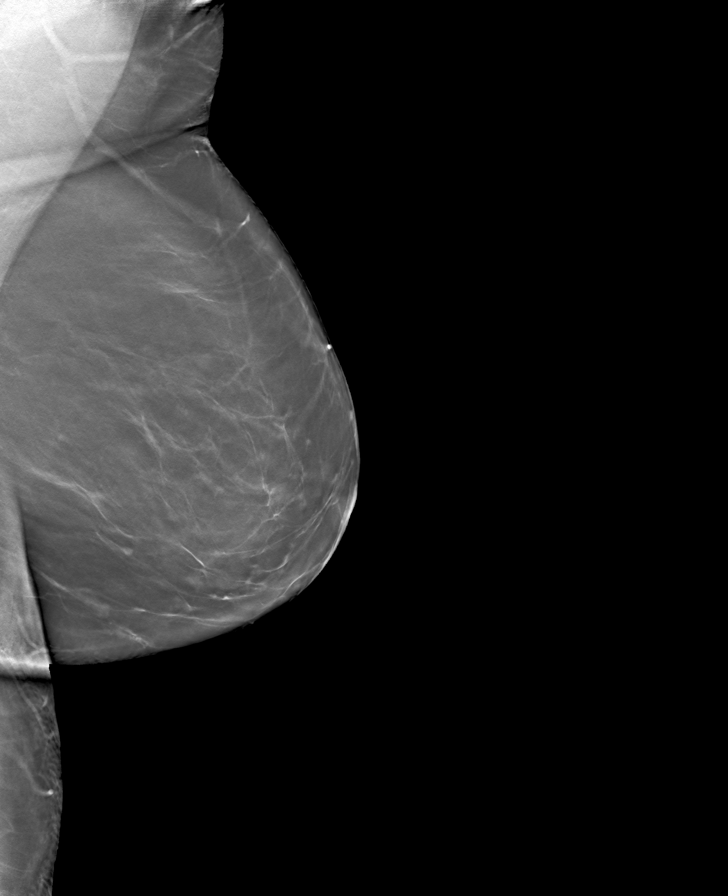

[L CC tomo · tomo slice 31/61.0]
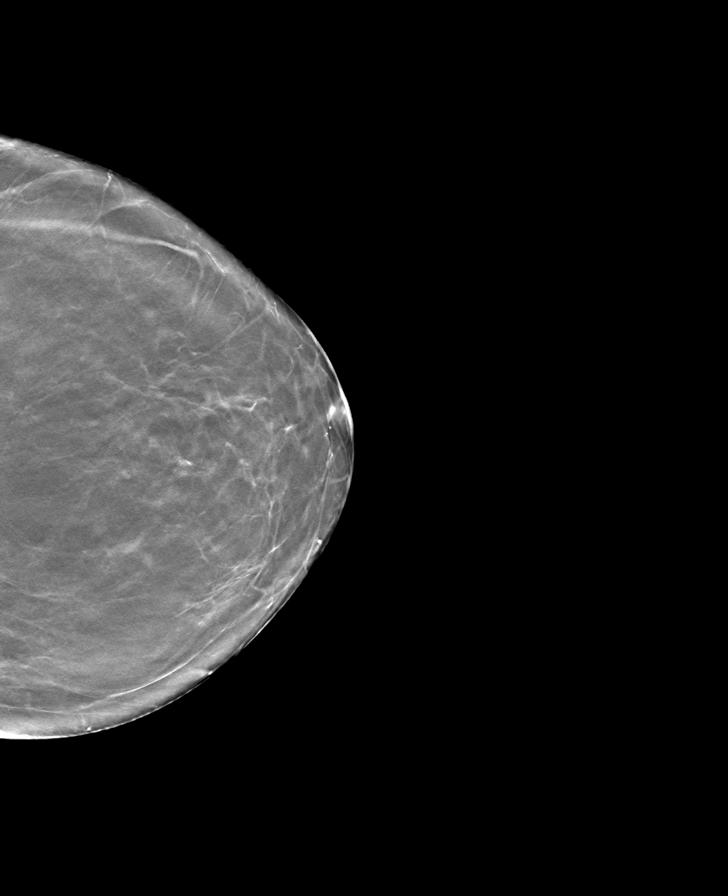

[R MLO tomo · tomo slice 33/66.0]
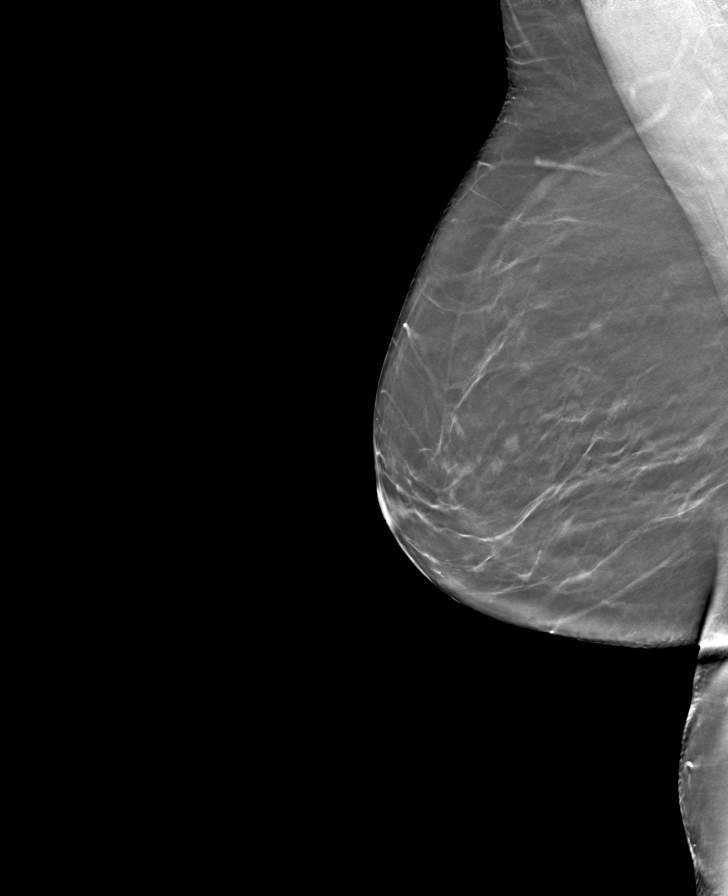

[8 of 24 positions shown; findings below may reference images not displayed]

ACR Breast Density Category b: There are scattered areas of
fibroglandular density.
FINDINGS: There are no findings suspicious for malignancy. Images were
processed with CAD.
IMPRESSION: No mammographic evidence of malignancy. A result letter of this
screening mammogram will be mailed directly to the patient.

RECOMMENDATION:
Screening mammogram in one year. (Code:[id number])

BI-RADS CATEGORY  1: Negative.

## 2022-03-09 ENCOUNTER — Ambulatory Visit (HOSPITAL_COMMUNITY)
Admission: RE | Admit: 2022-03-09 | Discharge: 2022-03-09 | Disposition: A | Discharge status: Home / Self Care | Payer: Medicare HMO | Source: Ambulatory Visit | Attending: Family Medicine | Admitting: Family Medicine

## 2022-03-09 DIAGNOSIS — Z1231 Encounter for screening mammogram for malignant neoplasm of breast: Secondary | ICD-10-CM | POA: Insufficient documentation

## 2022-05-29 DIAGNOSIS — I10 Essential (primary) hypertension: Secondary | ICD-10-CM | POA: Diagnosis not present

## 2022-05-29 DIAGNOSIS — E119 Type 2 diabetes mellitus without complications: Secondary | ICD-10-CM | POA: Diagnosis not present

## 2022-05-29 DIAGNOSIS — E663 Overweight: Secondary | ICD-10-CM | POA: Diagnosis not present

## 2022-05-29 DIAGNOSIS — Z0001 Encounter for general adult medical examination with abnormal findings: Secondary | ICD-10-CM | POA: Diagnosis not present

## 2022-05-29 DIAGNOSIS — Z1331 Encounter for screening for depression: Secondary | ICD-10-CM | POA: Diagnosis not present

## 2022-05-29 DIAGNOSIS — E782 Mixed hyperlipidemia: Secondary | ICD-10-CM | POA: Diagnosis not present

## 2022-05-29 DIAGNOSIS — E7849 Other hyperlipidemia: Secondary | ICD-10-CM | POA: Diagnosis not present

## 2022-05-29 DIAGNOSIS — Z6826 Body mass index (BMI) 26.0-26.9, adult: Secondary | ICD-10-CM | POA: Diagnosis not present

## 2023-01-18 DIAGNOSIS — H04123 Dry eye syndrome of bilateral lacrimal glands: Secondary | ICD-10-CM | POA: Diagnosis not present

## 2023-05-04 ENCOUNTER — Encounter: Payer: Self-pay | Admitting: *Deleted

## 2023-05-11 ENCOUNTER — Telehealth: Payer: Self-pay | Admitting: Internal Medicine

## 2023-05-11 NOTE — Telephone Encounter (Signed)
Questionnaire in review

## 2023-05-11 NOTE — Telephone Encounter (Signed)
Procedure: Colonoscopy  Height: 5'0 Weight: 154lbs        Have you had a colonoscopy before?  05/06/16, Dr. Jena Gauss  Do you have family history of colon cancer?  no  Do you have a family history of polyps? yes  Previous colonoscopy with polyps removed? yes  Do you have a history colorectal cancer?   no  Are you diabetic?  no  Do you have a prosthetic or mechanical heart valve? no  Do you have a pacemaker/defibrillator?   no  Have you had endocarditis/atrial fibrillation?  no  Do you use supplemental oxygen/CPAP?  no  Have you had joint replacement within the last 12 months?  no  Do you tend to be constipated or have to use laxatives?  no   Do you have history of alcohol use? If yes, how much and how often.  no  Do you have history or are you using drugs? If yes, what do are you  using?  no  Have you ever had a stroke/heart attack?  no  Have you ever had a heart or other vascular stent placed,?no  Do you take weight loss medication? no  female patients,: have you had a hysterectomy? no                              are you post menopausal?  yes                              do you still have your menstrual cycle? no    Date of last menstrual period?   Do you take any blood-thinning medications such as: (Plavix, aspirin, Coumadin, Aggrenox, Brilinta, Xarelto, Eliquis, Pradaxa, Savaysa or Effient)? Aspirin 81mg   If yes we need the name, milligram, dosage and who is prescribing doctor:               Current Outpatient Medications  Medication Sig Dispense Refill   aspirin EC 81 MG tablet Take 81 mg by mouth. 2-3 times weekly     b complex vitamins capsule Take 1 capsule by mouth daily.     BIOTIN PO Take 10,000 mcg by mouth daily.     Cholecalciferol (VITAMIN D3) 5000 UNITS CAPS Take 5,000 Units by mouth daily.  Plus K2     Coenzyme Q10 (COQ10) 100 MG CAPS Take by mouth daily.     COLLAGEN PO Take by mouth. 1600mg  daily     Omega-3 Fatty Acids (FISH OIL) 1200 MG  CAPS Take 1,200 mg by mouth daily.     omeprazole (PRILOSEC) 20 MG capsule TAKE 1 CAPSULE BY MOUTH ONCE DAILY 90 capsule 4   TURMERIC PO Take by mouth daily. With ginger     No current facility-administered medications for this visit.    Allergies  Allergen Reactions   Benadryl [Diphenhydramine Hcl] Other (See Comments)    Reaction:  Hyperactivity    Codeine Other (See Comments)    Reaction:  Hyperactivity    Amoxicillin Rash and Other (See Comments)    Has patient had a PCN reaction causing immediate rash, facial/tongue/throat swelling, SOB or lightheadedness with hypotension: No Has patient had a PCN reaction causing severe rash involving mucus membranes or skin necrosis: No Has patient had a PCN reaction that required hospitalization: No Has patient had a PCN reaction occurring within the last 10 years: No If all of the above  answers are "NO", then may proceed with Cephalosporin use.    Sulfa Antibiotics Rash

## 2023-05-11 NOTE — Addendum Note (Signed)
Addended by: Armstead Peaks on: 05/11/2023 04:31 PM   Modules accepted: Orders

## 2023-05-12 NOTE — Telephone Encounter (Signed)
Ok to schedule. ASA 2.  

## 2023-05-13 ENCOUNTER — Other Ambulatory Visit: Payer: Self-pay | Admitting: *Deleted

## 2023-05-13 ENCOUNTER — Encounter: Payer: Self-pay | Admitting: *Deleted

## 2023-05-13 MED ORDER — PEG 3350-KCL-NA BICARB-NACL 420 G PO SOLR: 4000.0000 mL | Freq: Once | ORAL | 0 refills | Status: AC

## 2023-05-13 NOTE — Telephone Encounter (Signed)
Referral completed, TCS apt letter sent to PCP

## 2023-05-13 NOTE — Telephone Encounter (Signed)
Pt has been scheduled for 07/01/23, instructions mailed and prep sent to the pharmacy.   [employer] PA: Approved Authorization #[id number]  Tracking #[id number] Dates of service 07/01/2023 - 09/28/2023

## 2023-05-24 ENCOUNTER — Other Ambulatory Visit (HOSPITAL_COMMUNITY): Payer: Self-pay | Admitting: Family Medicine

## 2023-05-24 DIAGNOSIS — Z1231 Encounter for screening mammogram for malignant neoplasm of breast: Secondary | ICD-10-CM

## 2023-06-03 ENCOUNTER — Encounter (HOSPITAL_COMMUNITY): Payer: Self-pay

## 2023-06-03 ENCOUNTER — Ambulatory Visit (HOSPITAL_COMMUNITY)
Admission: RE | Admit: 2023-06-03 | Discharge: 2023-06-03 | Disposition: A | Discharge status: Home / Self Care | Payer: Medicare HMO | Source: Ambulatory Visit | Attending: Family Medicine | Admitting: Family Medicine

## 2023-06-03 DIAGNOSIS — Z1231 Encounter for screening mammogram for malignant neoplasm of breast: Secondary | ICD-10-CM | POA: Insufficient documentation

## 2023-06-04 DIAGNOSIS — E1159 Type 2 diabetes mellitus with other circulatory complications: Secondary | ICD-10-CM | POA: Diagnosis not present

## 2023-06-04 DIAGNOSIS — Z1331 Encounter for screening for depression: Secondary | ICD-10-CM | POA: Diagnosis not present

## 2023-06-04 DIAGNOSIS — E782 Mixed hyperlipidemia: Secondary | ICD-10-CM | POA: Diagnosis not present

## 2023-06-04 DIAGNOSIS — I10 Essential (primary) hypertension: Secondary | ICD-10-CM | POA: Diagnosis not present

## 2023-06-04 DIAGNOSIS — Z6826 Body mass index (BMI) 26.0-26.9, adult: Secondary | ICD-10-CM | POA: Diagnosis not present

## 2023-06-04 DIAGNOSIS — Z0001 Encounter for general adult medical examination with abnormal findings: Secondary | ICD-10-CM | POA: Diagnosis not present

## 2023-06-04 DIAGNOSIS — E663 Overweight: Secondary | ICD-10-CM | POA: Diagnosis not present

## 2023-07-01 ENCOUNTER — Other Ambulatory Visit: Payer: Self-pay

## 2023-07-01 ENCOUNTER — Ambulatory Visit (HOSPITAL_COMMUNITY): Payer: Medicare HMO | Admitting: Anesthesiology

## 2023-07-01 ENCOUNTER — Encounter (HOSPITAL_COMMUNITY)
Admission: RE | Disposition: A | Discharge status: Home / Self Care | Payer: Self-pay | Source: Ambulatory Visit | Attending: Internal Medicine

## 2023-07-01 ENCOUNTER — Ambulatory Visit (HOSPITAL_COMMUNITY)
Admission: RE | Admit: 2023-07-01 | Discharge: 2023-07-01 | Disposition: A | Discharge status: Home / Self Care | Payer: Medicare HMO | Source: Ambulatory Visit | Attending: Internal Medicine | Admitting: Internal Medicine

## 2023-07-01 ENCOUNTER — Encounter (HOSPITAL_COMMUNITY): Payer: Self-pay | Admitting: Internal Medicine

## 2023-07-01 DIAGNOSIS — Z860101 Personal history of adenomatous and serrated colon polyps: Secondary | ICD-10-CM | POA: Diagnosis not present

## 2023-07-01 DIAGNOSIS — Z1211 Encounter for screening for malignant neoplasm of colon: Secondary | ICD-10-CM | POA: Insufficient documentation

## 2023-07-01 DIAGNOSIS — D124 Benign neoplasm of descending colon: Secondary | ICD-10-CM | POA: Insufficient documentation

## 2023-07-01 DIAGNOSIS — K635 Polyp of colon: Secondary | ICD-10-CM | POA: Diagnosis not present

## 2023-07-01 DIAGNOSIS — D125 Benign neoplasm of sigmoid colon: Secondary | ICD-10-CM | POA: Insufficient documentation

## 2023-07-01 DIAGNOSIS — D123 Benign neoplasm of transverse colon: Secondary | ICD-10-CM | POA: Diagnosis not present

## 2023-07-01 DIAGNOSIS — D128 Benign neoplasm of rectum: Secondary | ICD-10-CM | POA: Insufficient documentation

## 2023-07-01 DIAGNOSIS — I10 Essential (primary) hypertension: Secondary | ICD-10-CM | POA: Diagnosis not present

## 2023-07-01 DIAGNOSIS — D120 Benign neoplasm of cecum: Secondary | ICD-10-CM | POA: Insufficient documentation

## 2023-07-01 DIAGNOSIS — Z09 Encounter for follow-up examination after completed treatment for conditions other than malignant neoplasm: Secondary | ICD-10-CM | POA: Diagnosis not present

## 2023-07-01 DIAGNOSIS — E119 Type 2 diabetes mellitus without complications: Secondary | ICD-10-CM | POA: Insufficient documentation

## 2023-07-01 HISTORY — PX: POLYPECTOMY: SHX5525

## 2023-07-01 HISTORY — PX: COLONOSCOPY WITH PROPOFOL: SHX5780

## 2023-07-01 SURGERY — COLONOSCOPY WITH PROPOFOL
Anesthesia: General

## 2023-07-01 MED ORDER — PROPOFOL 500 MG/50ML IV EMUL
INTRAVENOUS | Status: AC
  Filled 2023-07-01: qty 50

## 2023-07-01 MED ORDER — LACTATED RINGERS IV SOLN: INTRAVENOUS | Status: DC

## 2023-07-01 MED ORDER — PROPOFOL 500 MG/50ML IV EMUL
INTRAVENOUS | Status: DC | PRN
  Administered 2023-07-01: 150 ug/kg/min via INTRAVENOUS

## 2023-07-01 MED ORDER — STERILE WATER FOR IRRIGATION IR SOLN
Status: DC | PRN
  Administered 2023-07-01: 60 mL

## 2023-07-01 NOTE — Transfer of Care (Signed)
Immediate Anesthesia Transfer of Care Note  Patient: Margaret Flowers [facility]  Procedure(s) Performed: COLONOSCOPY WITH PROPOFOL POLYPECTOMY  Patient Location: Endoscopy Unit  Anesthesia Type:General  Level of Consciousness: awake, alert , and oriented  Airway & Oxygen Therapy: Patient Spontanous Breathing  Post-op Assessment: Report given to RN and Post -op Vital signs reviewed and stable  Post vital signs: Reviewed and stable  Last Vitals:  Vitals Value Taken Time  BP    Temp    Pulse    Resp    SpO2      Last Pain:  Vitals:   07/01/23 0925  TempSrc:   PainSc: 0-No pain      Patients Stated Pain Goal: 4 (07/01/23 0752)  Complications: No notable events documented.

## 2023-07-01 NOTE — Discharge Instructions (Signed)
  Colonoscopy Discharge Instructions  Read the instructions outlined below and refer to this sheet in the next few weeks. These discharge instructions provide you with general information on caring for yourself after you leave the hospital. Your doctor may also give you specific instructions. While your treatment has been planned according to the most current medical practices available, unavoidable complications occasionally occur. If you have any problems or questions after discharge, call Dr. Jena Gauss at [phone]. ACTIVITY You may resume your regular activity, but move at a slower pace for the next 24 hours.  Take frequent rest periods for the next 24 hours.  Walking will help get rid of the air and reduce the bloated feeling in your belly (abdomen).  No driving for 24 hours (because of the medicine (anesthesia) used during the test).   Do not sign any important legal documents or operate any machinery for 24 hours (because of the anesthesia used during the test).  NUTRITION Drink plenty of fluids.  You may resume your normal diet as instructed by your doctor.  Begin with a light meal and progress to your normal diet. Heavy or fried foods are harder to digest and may make you feel sick to your stomach (nauseated).  Avoid alcoholic beverages for 24 hours or as instructed.  MEDICATIONS You may resume your normal medications unless your doctor tells you otherwise.  WHAT YOU CAN EXPECT TODAY Some feelings of bloating in the abdomen.  Passage of more gas than usual.  Spotting of blood in your stool or on the toilet paper.  IF YOU HAD POLYPS REMOVED DURING THE COLONOSCOPY: No aspirin products for 7 days or as instructed.  No alcohol for 7 days or as instructed.  Eat a soft diet for the next 24 hours.  FINDING OUT THE RESULTS OF YOUR TEST Not all test results are available during your visit. If your test results are not back during the visit, make an appointment with your caregiver to find out the  results. Do not assume everything is normal if you have not heard from your caregiver or the medical facility. It is important for you to follow up on all of your test results.  SEEK IMMEDIATE MEDICAL ATTENTION IF: You have more than a spotting of blood in your stool.  Your belly is swollen (abdominal distention).  You are nauseated or vomiting.  You have a temperature over 101.  You have abdominal pain or discomfort that is severe or gets worse throughout the day.     6 polyps removed from your colon today.  Further recommendations to follow pending review of pathology report  At patient request, I called Lee at [phone] -  rolled to voicemail.  Left a message.

## 2023-07-01 NOTE — Progress Notes (Signed)
Blood refusal consent signed

## 2023-07-01 NOTE — Anesthesia Preprocedure Evaluation (Signed)
Anesthesia Evaluation  Patient identified by MRN, date of birth, ID band Patient awake    Reviewed: Allergy & Precautions, H&P , NPO status , Patient's Chart, lab work & pertinent test results, reviewed documented beta blocker date and time   Airway Mallampati: II  TM Distance: >3 FB Neck ROM: full    Dental no notable dental hx.    Pulmonary neg pulmonary ROS   Pulmonary exam normal breath sounds clear to auscultation       Cardiovascular Exercise Tolerance: Good hypertension, negative cardio ROS  Rhythm:regular Rate:Normal     Neuro/Psych negative neurological ROS  negative psych ROS   GI/Hepatic negative GI ROS, Neg liver ROS,GERD  ,,  Endo/Other  negative endocrine ROSdiabetes    Renal/GU negative Renal ROS  negative genitourinary   Musculoskeletal   Abdominal   Peds  Hematology negative hematology ROS (+)   Anesthesia Other Findings   Reproductive/Obstetrics negative OB ROS                             Anesthesia Physical Anesthesia Plan  ASA: 2  Anesthesia Plan: General   Post-op Pain Management:    Induction:   PONV Risk Score and Plan: Propofol infusion  Airway Management Planned:   Additional Equipment:   Intra-op Plan:   Post-operative Plan:   Informed Consent: I have reviewed the patients History and Physical, chart, labs and discussed the procedure including the risks, benefits and alternatives for the proposed anesthesia with the patient or authorized representative who has indicated his/her understanding and acceptance.     Dental Advisory Given  Plan Discussed with: CRNA  Anesthesia Plan Comments:        Anesthesia Quick Evaluation

## 2023-07-01 NOTE — H&P (Addendum)
@LOGO @   Primary Care Physician:  Avis Epley, PA-C Primary Gastroenterologist:  Dr. Jena Gauss  Pre-Procedure History & Physical: HPI:  Margaret Flowers is a 67 y.o. female here for  Surveillance colonoscopy.  History of multiple colonic adenomas removed 2017.  Past Medical History:  Diagnosis Date   Diabetes mellitus without complication (HCC)    prediabetic   Elevated cholesterol    GERD (gastroesophageal reflux disease)    Hypertension    S/P colonoscopy 2002, 2007   2002: tubular adenomas; 2007: internal hemorrhoids, tubular adenoma    Past Surgical History:  Procedure Laterality Date   CHOLECYSTECTOMY N/A 07/10/2016   Procedure: LAPAROSCOPIC CHOLECYSTECTOMY;  Surgeon: Franky Macho, MD;  Location: [facility] ORS;  Service: General;  Laterality: N/A;   COLONOSCOPY N/A 05/06/2016   Procedure: COLONOSCOPY;  Surgeon: Corbin Ade, MD;  Location: [facility] END[facility] SUITE;  Service: Endoscopy;  Laterality: N/A;  11:30 AM   None      Prior to Admission medications   Medication Sig Start Date End Date Taking? Authorizing Provider  aspirin EC 81 MG tablet Take 81 mg by mouth. 2-3 times weekly   Yes [provider]  b complex vitamins capsule Take 1 capsule by mouth daily.   Yes [provider]  BIOTIN PO Take 10,000 mcg by mouth daily.   Yes [provider]  Cholecalciferol (VITAMIN D3) 5000 UNITS CAPS Take 5,000 Units by mouth daily.  Plus K2   Yes [provider]  Coenzyme Q10 (COQ10) 100 MG CAPS Take by mouth daily.    [provider]  COLLAGEN PO Take by mouth. 1600mg  daily    [provider]  Omega-3 Fatty Acids (FISH OIL) 1200 MG CAPS Take 1,200 mg by mouth daily.    [provider]  omeprazole (PRILOSEC) 20 MG capsule TAKE 1 CAPSULE BY MOUTH ONCE DAILY 04/08/18   Cyril Mourning A, NP  TURMERIC PO Take by mouth daily. With ginger    [provider]    Allergies as of 05/13/2023 - Review Complete  05/11/2023  Allergen Reaction Noted   Benadryl [diphenhydramine hcl] Other (See Comments) 12/14/2012   Codeine Other (See Comments) 12/14/2012   Amoxicillin Rash and Other (See Comments) 04/13/2016   Sulfa antibiotics Rash 12/14/2012    Family History  Problem Relation Age of Onset   Kidney cancer Mother        living   Skin cancer Mother    Breast cancer Mother    Breast cancer Sister        living   Pernicious anemia Father        deceased   Pancreatic cancer Brother        passed away age 87   Hypertension Son    Alzheimer's disease Maternal Grandmother    Parkinson's disease Maternal Grandfather    Fibromyalgia Sister    Hypertension Son    Parkinson's disease Maternal Uncle     Social History   Socioeconomic History   Marital status: Married    Spouse name: Not on file   Number of children: Not on file   Years of education: Not on file   Highest education level: Not on file  Occupational History   Occupation: Full-time     Employer: [employer]    Comment: Web designer   Tobacco Use   Smoking status: Never   Smokeless tobacco: Never  Vaping Use   Vaping status: Never Used  Substance and  Sexual Activity   Alcohol use: No   Drug use: No   Sexual activity: Yes    Birth control/protection: Post-menopausal  Other Topics Concern   Not on file  Social History Narrative   Not on file   Social Drivers of Health   Financial Resource Strain: Not on file  Food Insecurity: Not on file  Transportation Needs: Not on file  Physical Activity: Not on file  Stress: Not on file  Social Connections: Not on file  Intimate Partner Violence: Not on file    Review of Systems: See HPI, otherwise negative ROS  Physical Exam: BP 136/73   Pulse 90   Temp 98.1 F (36.7 C) (Oral)   Resp 18   Ht 5' 0.25" (1.53 m)   Wt 68 kg   SpO2 99%   BMI 29.05 kg/m  General:   Alert,  Well-developed, well-nourished, pleasant and cooperative in  NAD  Lungs:  Clear throughout to auscultation.   No wheezes, crackles, or rhonchi. No acute distress. Heart:  Regular rate and rhythm; no murmurs, clicks, rubs,  or gallops. Abdomen: Non-distended, normal bowel sounds.  Soft and nontender without appreciable mass or hepatosplenomegaly.  Impression/Plan:    67 year old lady with multiple colonic adenomas removed 2017 here for surveillance colonoscopy. The risks, benefits, limitations, alternatives and imponderables have been reviewed with the patient. Questions have been answered. All parties are agreeable.       Notice: This dictation was prepared with Dragon dictation along with smaller phrase technology. Any transcriptional errors that result from this process are unintentional and may not be corrected upon review.

## 2023-07-01 NOTE — Op Note (Signed)
[facility] Patient Name: Margaret Flowers Procedure Date: 07/01/2023 9:07 AM MRN: [id number] Date of Birth: August 16, 1955 Attending MD: Gennette Pac , MD, [id number] CSN: [id number] Age: 67 Admit Type: Outpatient Procedure:                Colonoscopy Indications:              High risk colon cancer surveillance: Personal                            history of colonic polyps Providers:                Gennette Pac, MD, Angelica Ran, Pandora Leiter, Technician Referring MD:              Medicines:                Propofol per Anesthesia Complications:            No immediate complications. Estimated Blood Loss:     Estimated blood loss was minimal. Procedure:                Pre-Anesthesia Assessment:                           - Prior to the procedure, a History and Physical                            was performed, and patient medications and                            allergies were reviewed. The patient's tolerance of                            previous anesthesia was also reviewed. The risks                            and benefits of the procedure and the sedation                            options and risks were discussed with the patient.                            All questions were answered, and informed consent                            was obtained. Prior Anticoagulants: The patient has                            taken no anticoagulant or antiplatelet agents. ASA                            Grade Assessment: II - A patient with mild systemic  disease. After reviewing the risks and benefits,                            the patient was deemed in satisfactory condition to                            undergo the procedure.                           After obtaining informed consent, the colonoscope                            was passed under direct vision. Throughout the                            procedure, the  patient's blood pressure, pulse, and                            oxygen saturations were monitored continuously. The                            [id number]) scope was introduced through the                            anus and advanced to the the cecum, identified by                            appendiceal orifice and ileocecal valve. The                            colonoscopy was performed without difficulty. The                            patient tolerated the procedure well. The quality                            of the bowel preparation was adequate. The                            ileocecal valve, appendiceal orifice, and rectum                            were photographed. The colonoscopy was performed                            without difficulty. Scope In: 9:32:48 AM Scope Out: 9:51:35 AM Scope Withdrawal Time: 0 hours 14 minutes 40 seconds  Total Procedure Duration: 0 hours 18 minutes 47 seconds  Findings:      The perianal and digital rectal examinations were normal.      Five semi-pedunculated polyps were found in the sigmoid colon, mid       sigmoid colon, mid descending colon, hepatic flexure and cecum. The       polyps were 4 to 6 mm in size. These polyps were removed with a cold  snare. Resection and retrieval were complete. Estimated blood loss was       minimal.      An 8 mm polyp was found in the distal rectum. The polyp was       semi-pedunculated. The polyp was removed with a hot snare. Resection and       retrieval were complete. Estimated blood loss: none.      The exam was otherwise without abnormality on direct and retroflexion       views. Impression:               - Five 4 to 6 mm polyps in the sigmoid colon, in                            the mid sigmoid colon, in the mid descending colon,                            at the hepatic flexure and in the cecum, removed                            with a cold snare. Resected and retrieved.                            - One 8 mm polyp in the distal rectum, removed with                            a hot snare. Resected and retrieved.                           - The examination was otherwise normal on direct                            and retroflexion views. Moderate Sedation:      Moderate (conscious) sedation was personally administered by an       anesthesia professional. The following parameters were monitored: oxygen       saturation, heart rate, blood pressure, respiratory rate, EKG, adequacy       of pulmonary ventilation, and response to care. Recommendation:           - Patient has a contact number available for                            emergencies. The signs and symptoms of potential                            delayed complications were discussed with the                            patient. Return to normal activities tomorrow.                            Written discharge instructions were provided to the                            patient.                           -  Advance diet as tolerated.                           - Continue present medications.                           - Repeat colonoscopy date to be determined after                            pending pathology results are reviewed for                            surveillance.                           - Return to GI office (date not yet determined). Procedure Code(s):        --- Professional ---                           [phone], Colonoscopy, flexible; with removal of                            tumor(s), polyp(s), or other lesion(s) by snare                            technique Diagnosis Code(s):        --- Professional ---                           Z86.010, Personal history of colonic polyps                           D12.5, Benign neoplasm of sigmoid colon                           D12.4, Benign neoplasm of descending colon                           D12.3, Benign neoplasm of transverse colon (hepatic                             flexure or splenic flexure)                           D12.0, Benign neoplasm of cecum                           D12.8, Benign neoplasm of rectum CPT copyright 2022 American [employer] Association. All rights reserved. The codes documented in this report are preliminary and upon coder review may  be revised to meet current compliance requirements. Gerrit Friends. Anabeth Chilcott, MD Gennette Pac, MD 07/01/2023 10:05:56 AM This report has been signed electronically. Number of Addenda: 0

## 2023-07-02 LAB — SURGICAL PATHOLOGY

## 2023-07-02 NOTE — Anesthesia Postprocedure Evaluation (Signed)
Anesthesia Post Note  Patient: Monte Hindman [facility]  Procedure(s) Performed: COLONOSCOPY WITH PROPOFOL POLYPECTOMY  Patient location during evaluation: Phase II Anesthesia Type: General Level of consciousness: awake Pain management: pain level controlled Vital Signs Assessment: post-procedure vital signs reviewed and stable Respiratory status: spontaneous breathing and respiratory function stable Cardiovascular status: blood pressure returned to baseline and stable Postop Assessment: no headache and no apparent nausea or vomiting Anesthetic complications: no Comments: Late entry   No notable events documented.   Last Vitals:  Vitals:   07/01/23 0752 07/01/23 0958  BP: 136/73 (!) 92/50  Pulse: 90 85  Resp: 18 18  Temp: 36.7 C 36.4 C  SpO2: 99% 99%    Last Pain:  Vitals:   07/01/23 0958  TempSrc: Oral  PainSc: 0-No pain                 Windell Norfolk

## 2023-07-12 ENCOUNTER — Encounter: Payer: Self-pay | Admitting: Internal Medicine

## 2023-07-12 NOTE — Addendum Note (Signed)
Addendum  created 07/12/23 1053 by Lorin Glass, CRNA   Intraprocedure Staff edited

## 2023-07-20 NOTE — Addendum Note (Signed)
 Addendum  created 07/20/23 1014 by Cockfield, Gwendolyn Lima., CRNA   Attestation recorded in [place], Intraprocedure Attestations filed

## 2023-07-22 ENCOUNTER — Encounter (HOSPITAL_COMMUNITY): Payer: Self-pay | Admitting: Internal Medicine

## 2024-01-19 DIAGNOSIS — H04123 Dry eye syndrome of bilateral lacrimal glands: Secondary | ICD-10-CM | POA: Diagnosis not present

## 2024-02-08 DIAGNOSIS — K219 Gastro-esophageal reflux disease without esophagitis: Secondary | ICD-10-CM | POA: Diagnosis not present

## 2024-02-08 DIAGNOSIS — E119 Type 2 diabetes mellitus without complications: Secondary | ICD-10-CM | POA: Diagnosis not present

## 2024-02-08 DIAGNOSIS — E785 Hyperlipidemia, unspecified: Secondary | ICD-10-CM | POA: Diagnosis not present

## 2024-02-08 DIAGNOSIS — Z7689 Persons encountering health services in other specified circumstances: Secondary | ICD-10-CM | POA: Diagnosis not present

## 2024-02-08 DIAGNOSIS — I10 Essential (primary) hypertension: Secondary | ICD-10-CM | POA: Diagnosis not present

## 2024-02-08 DIAGNOSIS — Z5320 Procedure and treatment not carried out because of patient's decision for unspecified reasons: Secondary | ICD-10-CM | POA: Diagnosis not present

## 2024-02-23 DIAGNOSIS — E785 Hyperlipidemia, unspecified: Secondary | ICD-10-CM | POA: Diagnosis not present

## 2024-02-23 DIAGNOSIS — E119 Type 2 diabetes mellitus without complications: Secondary | ICD-10-CM | POA: Diagnosis not present

## 2024-03-01 DIAGNOSIS — K219 Gastro-esophageal reflux disease without esophagitis: Secondary | ICD-10-CM | POA: Diagnosis not present

## 2024-03-01 DIAGNOSIS — E785 Hyperlipidemia, unspecified: Secondary | ICD-10-CM | POA: Diagnosis not present

## 2024-03-01 DIAGNOSIS — D649 Anemia, unspecified: Secondary | ICD-10-CM | POA: Diagnosis not present

## 2024-03-01 DIAGNOSIS — N289 Disorder of kidney and ureter, unspecified: Secondary | ICD-10-CM | POA: Diagnosis not present

## 2024-03-01 DIAGNOSIS — Z5320 Procedure and treatment not carried out because of patient's decision for unspecified reasons: Secondary | ICD-10-CM | POA: Diagnosis not present

## 2024-03-01 DIAGNOSIS — Z79899 Other long term (current) drug therapy: Secondary | ICD-10-CM | POA: Diagnosis not present

## 2024-03-01 DIAGNOSIS — I10 Essential (primary) hypertension: Secondary | ICD-10-CM | POA: Diagnosis not present

## 2024-03-01 DIAGNOSIS — Z0001 Encounter for general adult medical examination with abnormal findings: Secondary | ICD-10-CM | POA: Diagnosis not present

## 2024-03-01 DIAGNOSIS — E119 Type 2 diabetes mellitus without complications: Secondary | ICD-10-CM | POA: Diagnosis not present

## 2024-05-29 ENCOUNTER — Other Ambulatory Visit (HOSPITAL_COMMUNITY): Payer: Self-pay

## 2024-05-29 DIAGNOSIS — Z1231 Encounter for screening mammogram for malignant neoplasm of breast: Secondary | ICD-10-CM

## 2024-06-26 ENCOUNTER — Ambulatory Visit (HOSPITAL_COMMUNITY): Admission: RE | Admit: 2024-06-26 | Discharge: 2024-06-26 | Disposition: A | Source: Ambulatory Visit

## 2024-06-26 ENCOUNTER — Encounter (HOSPITAL_COMMUNITY): Payer: Self-pay

## 2024-06-26 DIAGNOSIS — Z1231 Encounter for screening mammogram for malignant neoplasm of breast: Secondary | ICD-10-CM
